# Patient Record
Sex: Female | Born: 1955
Health system: Southern US, Community
[De-identification: ages and names within clinical notes are randomized; demographics above are authoritative.]

## PROBLEM LIST (undated history)

## (undated) DIAGNOSIS — F900 Attention-deficit hyperactivity disorder, predominantly inattentive type: Secondary | ICD-10-CM

## (undated) DIAGNOSIS — F329 Major depressive disorder, single episode, unspecified: Secondary | ICD-10-CM

## (undated) DIAGNOSIS — F988 Other specified behavioral and emotional disorders with onset usually occurring in childhood and adolescence: Secondary | ICD-10-CM

## (undated) DIAGNOSIS — R413 Other amnesia: Principal | ICD-10-CM

## (undated) DIAGNOSIS — E213 Hyperparathyroidism, unspecified: Secondary | ICD-10-CM

## (undated) DIAGNOSIS — M1990 Unspecified osteoarthritis, unspecified site: Secondary | ICD-10-CM

## (undated) DIAGNOSIS — D649 Anemia, unspecified: Secondary | ICD-10-CM

## (undated) DIAGNOSIS — F419 Anxiety disorder, unspecified: Secondary | ICD-10-CM

## (undated) DIAGNOSIS — E559 Vitamin D deficiency, unspecified: Secondary | ICD-10-CM

## (undated) DIAGNOSIS — J45909 Unspecified asthma, uncomplicated: Secondary | ICD-10-CM

## (undated) DIAGNOSIS — M8580 Other specified disorders of bone density and structure, unspecified site: Secondary | ICD-10-CM

## (undated) DIAGNOSIS — J302 Other seasonal allergic rhinitis: Secondary | ICD-10-CM

## (undated) DIAGNOSIS — I10 Essential (primary) hypertension: Secondary | ICD-10-CM

## (undated) DIAGNOSIS — R0602 Shortness of breath: Secondary | ICD-10-CM

## (undated) DIAGNOSIS — I2510 Atherosclerotic heart disease of native coronary artery without angina pectoris: Secondary | ICD-10-CM

## (undated) DIAGNOSIS — J069 Acute upper respiratory infection, unspecified: Secondary | ICD-10-CM

## (undated) DIAGNOSIS — F411 Generalized anxiety disorder: Secondary | ICD-10-CM

## (undated) DIAGNOSIS — A6920 Lyme disease, unspecified: Secondary | ICD-10-CM

## (undated) HISTORY — PX: PARATHYROIDECTOMY: SHX19

## (undated) HISTORY — DX: Generalized anxiety disorder: F41.1

## (undated) HISTORY — PX: DIAGNOSTIC LAPAROSCOPY: SUR761

## (undated) HISTORY — DX: Hyperparathyroidism, unspecified: E21.3

## (undated) HISTORY — DX: Other amnesia: R41.3

## (undated) HISTORY — PX: OTHER SURGICAL HISTORY: SHX169

## (undated) HISTORY — PX: WISDOM TOOTH EXTRACTION: SHX21

## (undated) HISTORY — DX: Hypercalcemia: E83.52

## (undated) HISTORY — DX: Attention-deficit hyperactivity disorder, predominantly inattentive type: F90.0

## (undated) HISTORY — DX: Major depressive disorder, single episode, unspecified: F32.9

## (undated) HISTORY — DX: Other specified disorders of bone density and structure, unspecified site: M85.80

---

## 1998-12-01 ENCOUNTER — Other Ambulatory Visit: Admission: RE | Admit: 1998-12-01 | Discharge: 1998-12-01 | Payer: Self-pay | Admitting: Internal Medicine

## 2006-05-31 ENCOUNTER — Ambulatory Visit: Payer: Self-pay | Admitting: Internal Medicine

## 2006-07-15 ENCOUNTER — Ambulatory Visit: Payer: Self-pay | Admitting: Internal Medicine

## 2006-07-24 ENCOUNTER — Ambulatory Visit: Payer: Self-pay | Admitting: Internal Medicine

## 2006-07-24 LAB — CONVERTED CEMR LAB
Albumin: 3.5 g/dL (ref 3.5–5.2)
Alkaline Phosphatase: 74 units/L (ref 39–117)
Basophils Absolute: 0 10*3/uL (ref 0.0–0.1)
CO2: 29 meq/L (ref 19–32)
Creatinine, Ser: 0.9 mg/dL (ref 0.4–1.2)
Eosinophils Absolute: 0.3 10*3/uL (ref 0.0–0.6)
GFR calc non Af Amer: 70 mL/min
Glucose, Bld: 85 mg/dL (ref 70–99)
HDL: 46.6 mg/dL (ref >39.0)
Hemoglobin: 14.2 g/dL (ref 12.0–15.0)
MCHC: 36 g/dL (ref 30.0–36.0)
Monocytes Relative: 6.4 % (ref 3.0–11.0)
Neutro Abs: 5.8 10*3/uL (ref 1.4–7.7)
Neutrophils Relative %: 67.1 % (ref 43.0–77.0)
Platelets: 450 10*3/uL — ABNORMAL HIGH (ref 150–400)
Potassium: 4.3 meq/L (ref 3.5–5.1)
RBC: 4.43 M/uL (ref 3.87–5.11)
RDW: 12.6 % (ref 11.5–14.6)
Sodium: 140 meq/L (ref 135–145)
Total Bilirubin: 0.9 mg/dL (ref 0.3–1.2)
Total CHOL/HDL Ratio: 5.2
Total Protein: 6.8 g/dL (ref 6.0–8.3)
Triglycerides: 137 mg/dL (ref 0–149)
VLDL: 27 mg/dL (ref 0–40)

## 2007-03-07 DIAGNOSIS — F329 Major depressive disorder, single episode, unspecified: Secondary | ICD-10-CM

## 2007-03-20 ENCOUNTER — Encounter: Payer: Self-pay | Admitting: Internal Medicine

## 2007-07-03 HISTORY — PX: BACK SURGERY: SHX140

## 2008-01-08 ENCOUNTER — Observation Stay (HOSPITAL_COMMUNITY): Admission: RE | Admit: 2008-01-08 | Discharge: 2008-01-09 | Payer: Self-pay | Admitting: Orthopedic Surgery

## 2009-02-23 ENCOUNTER — Ambulatory Visit: Admission: RE | Admit: 2009-02-23 | Discharge: 2009-02-23 | Payer: Self-pay | Admitting: Gynecologic Oncology

## 2009-03-08 ENCOUNTER — Ambulatory Visit: Payer: Self-pay | Admitting: Genetic Counselor

## 2009-05-30 ENCOUNTER — Ambulatory Visit (HOSPITAL_COMMUNITY): Admission: RE | Admit: 2009-05-30 | Discharge: 2009-05-30 | Payer: Self-pay | Admitting: Obstetrics and Gynecology

## 2010-10-04 LAB — BASIC METABOLIC PANEL
BUN: 16 mg/dL (ref 6–23)
Chloride: 108 mEq/L (ref 96–112)
Creatinine, Ser: 0.68 mg/dL (ref 0.4–1.2)
GFR calc non Af Amer: >60 mL/min (ref >60)
Glucose, Bld: 95 mg/dL (ref 70–99)
Potassium: 4 mEq/L (ref 3.5–5.1)

## 2010-10-04 LAB — CBC
HCT: 38.8 % (ref 36.0–46.0)
MCV: 92.6 fL (ref 78.0–100.0)
Platelets: 434 10*3/uL — ABNORMAL HIGH (ref 150–400)
RDW: 13.4 % (ref 11.5–15.5)

## 2010-11-14 NOTE — Consult Note (Signed)
[name] [name], [name] [name]                 ACCOUNT NO.:  [id number]   MEDICAL RECORD NO.:  [id number]          PATIENT TYPE:  OUT   LOCATION:  GYN                          FACILITY:  [facility]   PHYSICIAN:  John T. Kyla Balzarine, M.D.    DATE OF BIRTH:  Sep 28, 1955   DATE OF CONSULTATION:  02/23/2009  DATE OF DISCHARGE:                                 CONSULTATION   CHIEF COMPLAINT:  This 55 year old nulligravida is seen at the request  of Dr. Vincente Poli with a family history and elevated CA-125 value in  conjunction with menorrhagia, endometrial polyp and secretory  endometrial biopsy.   HISTORY OF PRESENT ILLNESS:  This patient gives a longstanding history  of intermittent menorrhagia, most recently having 3 weeks of bleeding in  May.  She was investigated for iron deficiency anemia with hematocrit  32.4% and ferritin level of 4.  She had fatigue with her anemia and this  has improved since starting iron replacement.  The patient subsequently  saw Dr. Vincente Poli in early July, was treated with Prometrium and underwent  negative cervical cytology, endometrial biopsy with benign secretory  endometrium and on August 12 underwent pelvic ultrasound which showed  inhomogeneous myometrium with questionable adenomyosis, versus a 4.8-cm  fibroid, 7-mm endometrial thickness and a less than 2-cm questionable  hemorrhagic cyst of the left ovary.  No cysts involved the right ovary  and there was no free fluid.  Normal blood flow was identified within  the ovary.  Saline infusion was performed, revealing a 12-mm polypoid  mass within the endometrial cavity.  Prior to the this ultrasound, the  patient's sister had been diagnosed and debulked of ovarian cancer; CA-  125 value was obtained and was 42.7.  The patient, understandably, is  quite anxious regarding this result.  On close questioning, she denies  change in appetite, early satiety, bloating, increased GERD, obstructive  type symptoms or change in bowel function.   Last menstrual period onset  was August 24 and flow until now has been normal.   PAST MEDICAL HISTORY:  Significant for chronic acne, depression and  nullagravidity.  She has used oral contraceptives for more than 10 years  of her reproductive life, last in her 30s.  She has degenerative disk  disease and underwent a microlaminectomy.   CURRENT MEDICATIONS:  Include Pristiq, iron supplementation, Claritin,  Adderall, and Tazorac topical therapy for acne.   ALLERGIES:  SULFA DRUGS.   PERSONAL AND SOCIAL HISTORY:  The patient is married, admits occasional  tobacco use and less than 10 alcoholic beverages per week.   FAMILY HISTORY:  A maternal aunt had breast cancer in her 40s or 29s and  some unknown type of gynecologic malignancy.  The patient's sister was  diagnosed with ovarian cancer recently at age 27 and has not undergone  genetic testing, however, this is planned.  The patient's mother died  in her 8s of the eclampsia and her father had prostatic cancer in his  late 55s or 53s.  Maternal and paternal grandparents died in their 49s  and 84s also.  No other known  gynecologic, breast or colon malignancies.   REVIEW OF SYSTEMS:  Marked anxiety and fatigue related to anemia, as  above.  The patient is scheduled for colonoscopy in mid September but  this has not been performed yet.   EXAM:  VITAL SIGNS:  Weight 184 pounds and vital signs stable/ afebrile.  GENERAL:  The patient is markedly anxious, alert and oriented x3 in no  acute distress.  Pain score zero.  ENT:  Clear oropharynx, no scleral icterus and full extraocular  movements.  NECK:  Supple neck without goiter.  LYMPHATIC SURVEY:  Negative for pathologic lymphadenopathy.  LUNGS:  Clear.  HEART:  Regular rate and rhythm without JVD.  ABDOMEN:  Obese, soft and benign with no hernia, ascites, tenderness or  mass.  EXTREMITIES:  Full strength and range of motion without cords or  Homan's.  SKIN:  No suspicious  lesions.  NEUROLOGIC SCREEN:  Intact.  PELVIC:  External genitalia and BUS, bladder and urethra normal.  There  is menstrual blood in the vagina.  Cervix is without lesions and is  mobile without tenderness.  On bimanual and rectovaginal examinations,  uterus is upper limits size with no tenderness.  There is no adnexal  mass or cul-de-sac nodularity and no adnexal tenderness.   LABS:  As above.   ASSESSMENT:  1. Less than 2-cm left adnexal cyst, likely functional, associated      with borderline CA-125 value elevation in a menstruating woman with      secretory endometrium, implying ovulation.  2. Endometrial polyp.   I had a long discussion with the patient regarding differential  diagnosis.  Although her major concern revolves around ovarian cancer,  the likelihood of the cyst being malignant is extremely small.  I would  recommend repeating the CA-125 value, as it is frequently elevated in  premenopausal women.  This should be obtained in mid-September so that  there is at least 4-6 weeks between CA-125 values.  At some juncture,  the patient would need to undergo hysteroscopy/polypectomy with D and C.  This should be deferred until after colonoscopy and gynecologic  treatment planning.  The patient will consider options of total  laparoscopic hysterectomy with BSO, laparoscopic BSO combined with  hysteroscopic D and C or hysteroscopic D and C only with serial  observation of her CA-125 value has normalized.  We discussed the  theoretical benefits of prophylactic risk reduction BSO and believe  that this procedure could be performed by Dr. Vincente Poli.  We would be glad  to see her at any time on a p.r.n. basis if there is concern because of  a marked rise in her CA-125 value.      John T. Kyla Balzarine, M.D.  Electronically Signed     JTS/MEDQ  D:  02/23/2009  T:  02/23/2009  Job:  [id number]   cc:   Telford Nab, R.N.  [address]  [place], [place] [address]   Stann Mainland.  Vincente Poli, M.D.  Fax: [phone]

## 2010-11-14 NOTE — Op Note (Signed)
[name] [name], [name] [name]                 ACCOUNT NO.:  [id number]   MEDICAL RECORD NO.:  [id number]          PATIENT TYPE:  AMB   LOCATION:  DAY                          FACILITY:  [facility]   PHYSICIAN:  Ronald A. Gioffre, M.D.DATE OF BIRTH:  [birth year]   DATE OF PROCEDURE:  01/08/2008  DATE OF DISCHARGE:                               OPERATIVE REPORT   SURGEON:  Georges Lynch. Darrelyn Hillock, M.D.   ASSISTANT:  Marlowe Kays, M.D.   PREOPERATIVE DIAGNOSES:  1. Lateral recess stenosis at L5-S1 on the left.  2. Herniated lumbar disk L5-S1 on the left.  3. Degenerative disk disease at L5-S1 on the left.   POSTOPERATIVE DIAGNOSES:  1. Lateral recess stenosis at L5-S1 on the left.  2. Herniated lumbar disk L5-S1 on the left.  3. Degenerative disk disease at L5-S1 on the left.   OPERATION:  1. Decompression lateral recess for lateral recess stenosis at L5-S1      on the left.  2. Foraminotomy L5-S1 left.  3. Microdiskectomy L5-S1 left.   NOTE:  She had weakness of her plantar flexors preop.   ANESTHESIA:  General.   PROCEDURE IN DETAIL:  Under general anesthesia with the patient on  spinal frame, routine orthopedic prep and draping of the lower back was  carried out.  She had 2 grams of IV Ancef preop.  At this time after  sterile prepping and draping of the back was carried out two needles  were placed in the back for localization purposes.  X-ray was taken.  We  then made an incision over the L5-S1 interspace.  Bleeders identified  and cauterized.  I separated the muscle from both sides of the spinous  processes mainly for retraction purposes.  I then went down the left  side in the usual fashion and separated the muscle from the lamina and  the spinous process.  I identified the L5-S1 interspace and another x-  ray was taken to verify the position.  I then carried out a  hemilaminectomy in the usual fashion.  Note the disk space was collapsed  because of her degenerative arthritis so we had  to go far out wide to  decompress the lateral recesses well.  We went down and did a  foraminotomy and the microscope obviously was used on this part of the  procedure.  We gently removed the ligamentum flavum and protected the  dura at all times while we did this.  Note the lateral recess was quite  contracted.  She had a large mass of lateral recess veins that literally  was tethering the nerve root.  We gently lifted those veins up off the  root, held them with a nerve hook and cauterized those with the bipolar.  We finally separated the S1 root from the lateral recess.  We gently  retracted it over, identified the disk and made a cruciate incision in  the disk space and noted that there were some osteophytic changes there  and marked narrowing of the disk space.  We used the nerve hook and the  Epstein curettes to go  subligamentous and remove the disk material.  We  went caudal as well as proximal to complete the diskectomy.  We made  multiple passes to make sure the nerve root was free.  I also went in  the axilla of the root and made sure there were no fragments there.  There were none.  We did a nice foraminotomy for the root as well  distally.  When the procedure was completed we thoroughly irrigated the  area out.  We were now easily able to move the dura and the nerve root  over to the midline without any difficulty whatsoever.  Initially the  root was tethered down as I mentioned above.   I then loosely applied some thrombin-soaked Gelfoam, closed the wound in  layers in the usual fashion except the small deep distal part of the  wound was slightly left open for drainage purposes.  Subcu was closed  with Vicryl, skin with metal staples and a sterile Neosporin dressing  was applied.  The patient returned to the recovery room.           ______________________________  Georges Lynch Darrelyn Hillock, M.D.     RAG/MEDQ  D:  01/08/2008  T:  01/08/2008  Job:  [id number]

## 2010-11-17 NOTE — Assessment & Plan Note (Signed)
[facility]                            [facility] OFFICE NOTE   NAME:[name], [name] ALAMILLO                        MRN:          [id number]  DATE:05/31/2006                            DOB:          [birth year]    CHIEF COMPLAINT:  To establish, a couple of issues.   HISTORY OF PRESENT ILLNESS:  [name] [name] is a 55 year old, few cigarette  a day, married white female, carries the diagnosis of ADD with some  depression, under control, adult acne, who comes in today for a first-  time visit.  She previously had a primary care physician a long time  ago, Dr. Lenord Fellers, but has not had one most recently.  Her psychiatrist is  Dr. Evelene Croon, was previously Dr. Kathrynn Running, who moved out of the state.  She  uses the allergist across the street, I assume Dr. Blossom Hoops office,  and she sees Howard Pouch with Dr. Jorja Loa, a dermatologist.  She has  a remote history of seeing an orthopedist in the past, but nothing  recent, and she has also seen a neurologist about 8 years ago regarding  some neurologic symptoms that include an MRI of the brain and a spinal  tap, which was normal but some questioned abnormality on the brain, and  was told to recheck as needed.  Today her main concern is pain in her  right hip that is coming and going over the last year and a half.  At  some point it was so severe that it would wake her when sleeping, but is  usually associated with standing, exercise, and decreased with rest.  She describes it as to the right trochanteric area with occasionally  radiation to the groin, and one time was down to the knee, but not down  to her foot, without numbness and tingling.  She has no specific injury  in this area, has tried aspirin, Aleve and Advil, and the anti-  inflammatories do tend to help but got some stomach nausea at some point  and stopped, worried about the side effects.  Her other joints are  reasonably okay.  There was also a history of elevated  cholesterol  reading in a screening test a year and a half or two years ago, and has  not had blood work done at any time in the recent past.   PAST MEDICAL HISTORY:  See database.  SURGERIES:  None.  Last Pap June  2007 by GYN.  She is primiparous.  Last mammogram June 2007.  LMP June  28.  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   MEDICATIONS:  1. Prozac 40 mg a day.  2. Adderall 20 mg b.i.d.  3. Wellbutrin - unclear dosage.  4. Tazorac 0.5% for face.   DRUG ALLERGIES:  SULFA.   FAMILY HISTORY:  Father died at age 41 of leukemia and possibly side  effects of radiation treatment for the prostate cancer that he had,  mother died when she was age 53 of a cerebral hemorrhage.  A  maternal  grandmother had a stroke of some sort.  She had a first cousin with type  1 diabetes and uncles with type 2 diabetes.  No substance abuse problems  in the family to her knowledge or ADHD that she is aware of.  No family  history of arthritis that she is aware of.   SOCIAL HISTORY:  Household of 2.  Tetanus shot 7 years ago.  Has never  had a colonoscopy.  Works for a [employer], is nonprofit work.  Tobacco a couple of cigarettes here and there, smoked when she was in  teen or college but has stopped.  Alcohol max 5 a week usually.  Some  caffeine.  Exercise minimal because is limited by her right hip pain.  See database.   REVIEW OF SYSTEMS:  Negative for chest pain, shortness of breath, other  significant symptoms relating to HPI.   PHYSICAL EXAMINATION:  Height 5 feet 5-1/4inches, weight 185,  temperature 98.7.  Pulse 72 and regular, blood pressure 132/68,  respirations 20.  This is a well-developed, well-nourished, healthy-appearing, middle-aged  lady in no acute distress.  Her gait is not antalgic, and her balance  appears good.  NECK:  Supple without masses, thyromegaly or bruit.  CHEST:  CTA, BS   equal.  CARDIAC:  S1, S2, no gallops or murmurs.  Peripheral pulses present  without delay, no obvious edema.  ABDOMEN:  Soft without organomegaly, guarding or rebound.  Examination of her hip, she has good range of motion with some  discomfort on internal/external rotation, and no joint effusion is  noted.  NEUROLOGIC:  Grossly intact.  There is no obvious atrophy.   IMPRESSION:  1. Right hip pain, bursitis and/or arthritis.  I feel this needs      orthopedic evaluation and imaging of some sort.  History of      questioned side effect to the nonsteroidal anti-inflammatory      medications.  However, they were quite helpful at times.  She can      go ahead and take 2 Aleve twice daily as needed, but can use      Prilosec for gastrointestinal protection.  We discussed plans as      far as optimization of some type of exercise that will not      aggravate her hip pain, and get better delineation as to the cause      to prevent further problem.  2. Attention deficit disorder.  Followed by Dr. Evelene Croon.  3. History of hyperlipidemia or elevated cholesterol.  4. History of a neurologic workup 7 to 10 years ago.  We will get      records in this regard.  We will schedule for complete physical      examination labs and a 30-minute slot to go over these things, and      will follow up with other medical issues as appropriate.     Neta Mends. Panosh, MD  Electronically Signed    WKP/MedQ  DD: 05/31/2006  DT: 06/02/2006  Job #: [id number]

## 2011-03-29 LAB — URINALYSIS, ROUTINE W REFLEX MICROSCOPIC
Hgb urine dipstick: NEGATIVE
Ketones, ur: NEGATIVE
Nitrite: NEGATIVE

## 2011-03-29 LAB — CBC
Platelets: 482 — ABNORMAL HIGH
RDW: 13.4
WBC: 9.8

## 2011-03-29 LAB — URINE CULTURE
Colony Count: NO GROWTH
Culture: NO GROWTH

## 2011-03-29 LAB — COMPREHENSIVE METABOLIC PANEL
ALT: 31
AST: 30
Albumin: 3.8
Alkaline Phosphatase: 84
Potassium: 4.5
Sodium: 140
Total Protein: 7

## 2011-03-29 LAB — DIFFERENTIAL
Basophils Relative: 0
Eosinophils Absolute: 0.3
Eosinophils Relative: 3
Monocytes Absolute: 0.8
Monocytes Relative: 8

## 2011-03-29 LAB — APTT: aPTT: 34

## 2011-07-03 HISTORY — PX: DILATION AND CURETTAGE OF UTERUS: SHX78

## 2011-07-10 ENCOUNTER — Other Ambulatory Visit: Payer: Self-pay

## 2011-07-10 ENCOUNTER — Other Ambulatory Visit: Payer: Self-pay | Admitting: Family Medicine

## 2011-07-10 DIAGNOSIS — D259 Leiomyoma of uterus, unspecified: Secondary | ICD-10-CM

## 2011-07-13 ENCOUNTER — Ambulatory Visit: Payer: Self-pay

## 2011-07-13 ENCOUNTER — Ambulatory Visit
Admission: RE | Admit: 2011-07-13 | Discharge: 2011-07-13 | Disposition: A | Discharge status: Home / Self Care | Payer: BC Managed Care – PPO | Source: Ambulatory Visit | Attending: Family Medicine | Admitting: Family Medicine

## 2011-07-13 DIAGNOSIS — D259 Leiomyoma of uterus, unspecified: Secondary | ICD-10-CM

## 2011-08-14 ENCOUNTER — Other Ambulatory Visit: Payer: Self-pay | Admitting: Obstetrics and Gynecology

## 2011-08-21 ENCOUNTER — Other Ambulatory Visit: Payer: Self-pay | Admitting: Family Medicine

## 2011-08-21 DIAGNOSIS — M79606 Pain in leg, unspecified: Secondary | ICD-10-CM

## 2011-08-22 ENCOUNTER — Other Ambulatory Visit: Payer: Self-pay | Admitting: Family Medicine

## 2011-08-22 DIAGNOSIS — M79606 Pain in leg, unspecified: Secondary | ICD-10-CM

## 2011-08-23 ENCOUNTER — Ambulatory Visit
Admission: RE | Admit: 2011-08-23 | Discharge: 2011-08-23 | Disposition: A | Discharge status: Home / Self Care | Payer: BC Managed Care – PPO | Source: Ambulatory Visit | Attending: Family Medicine | Admitting: Family Medicine

## 2011-08-23 ENCOUNTER — Other Ambulatory Visit: Payer: BC Managed Care – PPO

## 2011-08-23 DIAGNOSIS — M79606 Pain in leg, unspecified: Secondary | ICD-10-CM

## 2011-10-29 ENCOUNTER — Other Ambulatory Visit: Payer: Self-pay | Admitting: Family Medicine

## 2011-10-29 DIAGNOSIS — Z1231 Encounter for screening mammogram for malignant neoplasm of breast: Secondary | ICD-10-CM

## 2011-10-29 DIAGNOSIS — Z780 Asymptomatic menopausal state: Secondary | ICD-10-CM

## 2011-11-06 ENCOUNTER — Other Ambulatory Visit: Payer: BC Managed Care – PPO

## 2011-11-06 ENCOUNTER — Ambulatory Visit: Payer: BC Managed Care – PPO

## 2011-11-27 ENCOUNTER — Ambulatory Visit: Payer: BC Managed Care – PPO

## 2011-11-27 ENCOUNTER — Other Ambulatory Visit: Payer: BC Managed Care – PPO

## 2012-02-27 ENCOUNTER — Encounter (HOSPITAL_COMMUNITY): Payer: Self-pay | Admitting: Pharmacist

## 2012-03-06 ENCOUNTER — Encounter (HOSPITAL_COMMUNITY): Payer: Self-pay | Admitting: *Deleted

## 2012-03-10 ENCOUNTER — Encounter (HOSPITAL_COMMUNITY): Payer: Self-pay

## 2012-03-10 ENCOUNTER — Encounter (HOSPITAL_COMMUNITY)
Admission: RE | Admit: 2012-03-10 | Discharge: 2012-03-10 | Disposition: A | Discharge status: Home / Self Care | Payer: BC Managed Care – PPO | Source: Ambulatory Visit | Attending: Obstetrics and Gynecology | Admitting: Obstetrics and Gynecology

## 2012-03-10 HISTORY — DX: Shortness of breath: R06.02

## 2012-03-10 HISTORY — DX: Acute upper respiratory infection, unspecified: J06.9

## 2012-03-10 LAB — CBC
HCT: 26.3 % — ABNORMAL LOW (ref 36.0–46.0)
MCHC: 30.8 g/dL (ref 30.0–36.0)
MCV: 92.6 fL (ref 78.0–100.0)
Platelets: 505 10*3/uL — ABNORMAL HIGH (ref 150–400)
RDW: 14.4 % (ref 11.5–15.5)

## 2012-03-10 NOTE — Pre-Procedure Instructions (Signed)
Pt c/o upper resp symptoms, which she thinks is allergy related. She has a productive cough and head congestion. Pt is afebrile. Dr. Arby Barrette made aware of pt's symptoms.

## 2012-03-10 NOTE — Patient Instructions (Signed)
Your procedure is scheduled on:03/12/12  Enter through the Main Entrance at :1130 am Pick up desk phone and dial [phone] and inform us of your arrival.  Please call [phone] if you have any problems the morning of surgery.  Remember: Do not eat after midnight:Tuesday Do not drink after:9am on Wed  Take these meds the morning of surgery with a sip of water:Lexapro, allergy med   DO NOT wear jewelry, eye make-up, lipstick,body lotion, or dark fingernail polish. Do not shave for 48 hours prior to surgery.  Patients discharged on the day of surgery will not be allowed to drive home.   Remember to use your Hibiclens as instructed.

## 2012-03-12 ENCOUNTER — Encounter (HOSPITAL_COMMUNITY): Payer: Self-pay | Admitting: Anesthesiology

## 2012-03-12 ENCOUNTER — Ambulatory Visit (HOSPITAL_COMMUNITY): Payer: BC Managed Care – PPO | Admitting: Anesthesiology

## 2012-03-12 ENCOUNTER — Encounter (HOSPITAL_COMMUNITY): Payer: Self-pay | Admitting: *Deleted

## 2012-03-12 ENCOUNTER — Encounter (HOSPITAL_COMMUNITY)
Admission: RE | Disposition: A | Discharge status: Home / Self Care | Payer: Self-pay | Source: Ambulatory Visit | Attending: Obstetrics and Gynecology

## 2012-03-12 ENCOUNTER — Ambulatory Visit (HOSPITAL_COMMUNITY)
Admission: RE | Admit: 2012-03-12 | Discharge: 2012-03-12 | Disposition: A | Discharge status: Home / Self Care | Payer: BC Managed Care – PPO | Source: Ambulatory Visit | Attending: Obstetrics and Gynecology | Admitting: Obstetrics and Gynecology

## 2012-03-12 DIAGNOSIS — N925 Other specified irregular menstruation: Secondary | ICD-10-CM | POA: Insufficient documentation

## 2012-03-12 DIAGNOSIS — Z01818 Encounter for other preprocedural examination: Secondary | ICD-10-CM | POA: Insufficient documentation

## 2012-03-12 DIAGNOSIS — N949 Unspecified condition associated with female genital organs and menstrual cycle: Secondary | ICD-10-CM | POA: Insufficient documentation

## 2012-03-12 DIAGNOSIS — F3289 Other specified depressive episodes: Secondary | ICD-10-CM

## 2012-03-12 DIAGNOSIS — F329 Major depressive disorder, single episode, unspecified: Secondary | ICD-10-CM

## 2012-03-12 DIAGNOSIS — D649 Anemia, unspecified: Secondary | ICD-10-CM | POA: Insufficient documentation

## 2012-03-12 DIAGNOSIS — N840 Polyp of corpus uteri: Secondary | ICD-10-CM

## 2012-03-12 DIAGNOSIS — N938 Other specified abnormal uterine and vaginal bleeding: Secondary | ICD-10-CM | POA: Insufficient documentation

## 2012-03-12 DIAGNOSIS — Z01812 Encounter for preprocedural laboratory examination: Secondary | ICD-10-CM | POA: Insufficient documentation

## 2012-03-12 HISTORY — DX: Major depressive disorder, single episode, unspecified: F32.9

## 2012-03-12 HISTORY — DX: Anxiety disorder, unspecified: F41.9

## 2012-03-12 HISTORY — DX: Anemia, unspecified: D64.9

## 2012-03-12 HISTORY — DX: Vitamin D deficiency, unspecified: E55.9

## 2012-03-12 HISTORY — DX: Other seasonal allergic rhinitis: J30.2

## 2012-03-12 HISTORY — DX: Other specified behavioral and emotional disorders with onset usually occurring in childhood and adolescence: F98.8

## 2012-03-12 SURGERY — DILATATION & CURETTAGE/HYSTEROSCOPY WITH THERMACHOICE ABLATION
Anesthesia: Choice | Site: Uterus | Wound class: Clean Contaminated

## 2012-03-12 MED ORDER — HYDROCODONE-ACETAMINOPHEN 5-325 MG PO TABS
ORAL_TABLET | ORAL | Status: AC
  Filled 2012-03-12: qty 1

## 2012-03-12 MED ORDER — BUPIVACAINE IN DEXTROSE 0.75-8.25 % IT SOLN
INTRATHECAL | Status: DC | PRN
  Administered 2012-03-12: 1.2 mL via INTRATHECAL

## 2012-03-12 MED ORDER — MEPERIDINE HCL 25 MG/ML IJ SOLN: 6.2500 mg | INTRAMUSCULAR | Status: DC | PRN

## 2012-03-12 MED ORDER — PROPOFOL 10 MG/ML IV EMUL
INTRAVENOUS | Status: DC | PRN
  Administered 2012-03-12: 20 mg via INTRAVENOUS
  Administered 2012-03-12 (×3): 10 mg via INTRAVENOUS

## 2012-03-12 MED ORDER — ONDANSETRON HCL 4 MG/2ML IJ SOLN
INTRAMUSCULAR | Status: AC
  Filled 2012-03-12: qty 2

## 2012-03-12 MED ORDER — KETOROLAC TROMETHAMINE 30 MG/ML IJ SOLN
INTRAMUSCULAR | Status: AC
  Filled 2012-03-12: qty 1

## 2012-03-12 MED ORDER — LACTATED RINGERS IV SOLN
INTRAVENOUS | Status: DC
  Administered 2012-03-12: 12:00:00 via INTRAVENOUS

## 2012-03-12 MED ORDER — FENTANYL CITRATE 0.05 MG/ML IJ SOLN
INTRAMUSCULAR | Status: AC
  Administered 2012-03-12: 50 ug via INTRAVENOUS
  Filled 2012-03-12: qty 2

## 2012-03-12 MED ORDER — IBUPROFEN 200 MG PO TABS: 600.0000 mg | ORAL_TABLET | Freq: Four times a day (QID) | ORAL | Status: AC | PRN

## 2012-03-12 MED ORDER — MIDAZOLAM HCL 2 MG/2ML IJ SOLN
INTRAMUSCULAR | Status: AC
  Filled 2012-03-12: qty 2

## 2012-03-12 MED ORDER — LACTATED RINGERS IV SOLN
INTRAVENOUS | Status: DC
  Administered 2012-03-12 (×2): via INTRAVENOUS

## 2012-03-12 MED ORDER — LIDOCAINE HCL 1 % IJ SOLN
INTRAMUSCULAR | Status: DC | PRN
  Administered 2012-03-12: 20 mL

## 2012-03-12 MED ORDER — OXYCODONE-ACETAMINOPHEN 5-325 MG PO TABS: 1.0000 | ORAL_TABLET | ORAL | Status: AC | PRN

## 2012-03-12 MED ORDER — KETOROLAC TROMETHAMINE 30 MG/ML IJ SOLN
INTRAMUSCULAR | Status: DC | PRN
  Administered 2012-03-12: 30 mg via INTRAVENOUS

## 2012-03-12 MED ORDER — PROPOFOL 10 MG/ML IV EMUL
INTRAVENOUS | Status: AC
  Filled 2012-03-12: qty 20

## 2012-03-12 MED ORDER — CEFAZOLIN SODIUM-DEXTROSE 2-3 GM-% IV SOLR
2.0000 g | INTRAVENOUS | Status: AC
  Administered 2012-03-12: 2 g via INTRAVENOUS

## 2012-03-12 MED ORDER — DEXTROSE 5 % IV SOLN
INTRAVENOUS | Status: DC | PRN
  Administered 2012-03-12: 1000 mL via INTRAVENOUS

## 2012-03-12 MED ORDER — FERUMOXYTOL INJECTION 510 MG/17 ML
510.0000 mg | Freq: Once | INTRAVENOUS | Status: AC
  Administered 2012-03-12: 510 mg via INTRAVENOUS
  Filled 2012-03-12: qty 17

## 2012-03-12 MED ORDER — ONDANSETRON HCL 4 MG/2ML IJ SOLN
INTRAMUSCULAR | Status: DC | PRN
  Administered 2012-03-12: 4 mg via INTRAVENOUS

## 2012-03-12 MED ORDER — FENTANYL CITRATE 0.05 MG/ML IJ SOLN
25.0000 ug | INTRAMUSCULAR | Status: DC | PRN
  Administered 2012-03-12: 50 ug via INTRAVENOUS

## 2012-03-12 MED ORDER — FENTANYL CITRATE 0.05 MG/ML IJ SOLN
INTRAMUSCULAR | Status: AC
  Filled 2012-03-12: qty 2

## 2012-03-12 MED ORDER — LIDOCAINE HCL (CARDIAC) 20 MG/ML IV SOLN
INTRAVENOUS | Status: DC | PRN
  Administered 2012-03-12 (×2): 20 mg via INTRAVENOUS

## 2012-03-12 MED ORDER — MIDAZOLAM HCL 5 MG/5ML IJ SOLN
INTRAMUSCULAR | Status: DC | PRN
  Administered 2012-03-12: 2 mg via INTRAVENOUS

## 2012-03-12 MED ORDER — HYDROCODONE-ACETAMINOPHEN 5-325 MG PO TABS
1.0000 | ORAL_TABLET | Freq: Once | ORAL | Status: AC
  Administered 2012-03-12: 1 via ORAL

## 2012-03-12 MED ORDER — LIDOCAINE HCL (CARDIAC) 20 MG/ML IV SOLN
INTRAVENOUS | Status: AC
  Filled 2012-03-12: qty 5

## 2012-03-12 MED ORDER — CEFAZOLIN SODIUM-DEXTROSE 2-3 GM-% IV SOLR
INTRAVENOUS | Status: AC
  Filled 2012-03-12: qty 50

## 2012-03-12 MED ORDER — METOCLOPRAMIDE HCL 5 MG/ML IJ SOLN: 10.0000 mg | Freq: Once | INTRAMUSCULAR | Status: DC | PRN

## 2012-03-12 SURGICAL SUPPLY — 13 items
CANISTER SUCTION 2500CC (MISCELLANEOUS) ×2 IMPLANT
CATH ROBINSON RED A/P 16FR (CATHETERS) ×2 IMPLANT
CATH THERMACHOICE III (CATHETERS) ×2 IMPLANT
CLOTH BEACON ORANGE TIMEOUT ST (SAFETY) ×2 IMPLANT
CONTAINER PREFILL 10% NBF 60ML (FORM) ×4 IMPLANT
DRESSING TELFA 8X3 (GAUZE/BANDAGES/DRESSINGS) ×2 IMPLANT
GLOVE BIO SURGEON STRL SZ 6.5 (GLOVE) ×4 IMPLANT
GOWN PREVENTION PLUS LG XLONG (DISPOSABLE) ×2 IMPLANT
GOWN STRL REIN XL XLG (GOWN DISPOSABLE) ×2 IMPLANT
PACK HYSTEROSCOPY LF (CUSTOM PROCEDURE TRAY) ×2 IMPLANT
PAD OB MATERNITY 4.3X12.25 (PERSONAL CARE ITEMS) ×2 IMPLANT
TOWEL OR 17X24 6PK STRL BLUE (TOWEL DISPOSABLE) ×4 IMPLANT
WATER STERILE IRR 1000ML POUR (IV SOLUTION) ×2 IMPLANT

## 2012-03-12 NOTE — Anesthesia Postprocedure Evaluation (Signed)
  Anesthesia Post-op Note  Patient: [name] [name]  Procedure(s) Performed: Procedure(s) (LRB) with comments: DILATATION & CURETTAGE/HYSTEROSCOPY WITH THERMACHOICE ABLATION (N/A)  Patient Location: PACU  Anesthesia Type: Spinal  Level of Consciousness: awake, alert  and oriented  Airway and Oxygen Therapy: Patient Spontanous Breathing  Post-op Pain: none  Post-op Assessment: Post-op Vital signs reviewed, Patient's Cardiovascular Status Stable, Respiratory Function Stable, Patent Airway, No signs of Nausea or vomiting, Pain level controlled, No headache and No backache  Post-op Vital Signs: Reviewed and stable  Complications: No apparent anesthesia complications

## 2012-03-12 NOTE — Anesthesia Preprocedure Evaluation (Signed)
Anesthesia Evaluation  Patient identified by MRN, date of birth, ID band Patient awake    Reviewed: Allergy & Precautions, H&P , NPO status , Patient's Chart, lab work & pertinent test results  Airway Mallampati: I TM Distance: >3 FB Neck ROM: Full    Dental No notable dental hx. (+) Teeth Intact   Pulmonary shortness of breath and with exertion,  Allergic Rhinitis breath sounds clear to auscultation  Pulmonary exam normal       Cardiovascular negative cardio ROS  Rhythm:Regular Rate:Normal     Neuro/Psych PSYCHIATRIC DISORDERS Anxiety Depression ADD on Adderallnegative neurological ROS     GI/Hepatic negative GI ROS, Neg liver ROS,   Endo/Other  negative endocrine ROS  Renal/GU negative Renal ROS  negative genitourinary   Musculoskeletal negative musculoskeletal ROS (+)   Abdominal (+) + obese,   Peds  Hematology Anemia   Anesthesia Other Findings   Reproductive/Obstetrics Endometrial polyp PMB                           Anesthesia Physical Anesthesia Plan  ASA: II  Anesthesia Plan: Spinal   Post-op Pain Management:    Induction:   Airway Management Planned: Natural Airway  Additional Equipment:   Intra-op Plan:   Post-operative Plan: Extubation in OR  Informed Consent: I have reviewed the patients History and Physical, chart, labs and discussed the procedure including the risks, benefits and alternatives for the proposed anesthesia with the patient or authorized representative who has indicated his/her understanding and acceptance.   Dental advisory given  Plan Discussed with: Anesthesiologist, CRNA and Surgeon  Anesthesia Plan Comments:         Anesthesia Quick Evaluation

## 2012-03-12 NOTE — H&P (Signed)
56 year old G 1 P0 presents complaining of heavy vaginal bleeding. She had been 6 months without any bleeding and presented to my office on August 28 with heavy vaginal bleeding and a hemoglobin of 8. Ultrasound showed an enlarged uterus with a polyp with a "feeder" vessel in the endometrium. She had a small simple cyst on the right ovary. No definite fibroid seen.  Medical history Anemia  Surgical history  Laminectomy 2009  Family history  Sister with ovarian cancer  Meds Adderall  Lexapro claritin  Allergic to sulfa  Social history unremarkable  No tobacco No alcohol  ROS positive for fatigue  BP 104/50 General alert and oriented Lung CTAB Car RRR Abdomen is soft and nontender Pelvic enlarged uterus Moderate amount of vaginal bleeding  IMPRESSION: Endometrial polyp Menorrhagia  PLAN: D and C Hysteroscopy Endometrial polyp resection Thermachoice Endometrial ablation Risks reviewed with patient Consent signed

## 2012-03-12 NOTE — Brief Op Note (Signed)
03/12/2012  1:07 PM  PATIENT:  [name] [name]  55 y.o. female  PRE-OPERATIVE DIAGNOSIS:  EM polyp  POST-OPERATIVE DIAGNOSIS:  EM polyp  PROCEDURE:  Procedure(s) (LRB) with comments: DILATATION & CURETTAGE/HYSTEROSCOPY WITH THERMACHOICE ABLATION (N/A) Removal of endometrial tissue  SURGEON:  Surgeon(s) and Role:    * Jeani Hawking, MD - Primary  PHYSICIAN ASSISTANT:   ASSISTANTS: none   ANESTHESIA:   spinal  EBL:  Total I/O In: 800 [I.V.:800] Out: 100 [Urine:100]  BLOOD ADMINISTERED:none  DRAINS: none   LOCAL MEDICATIONS USED:  XYLOCAINE   SPECIMEN:  Source of Specimen:  uterine curettings and endometrial polyp  DISPOSITION OF SPECIMEN:  PATHOLOGY  COUNTS:  YES  TOURNIQUET:  * No tourniquets in log *  DICTATION: .Other Dictation: Dictation Number [id number]  PLAN OF CARE: Discharge to home after PACU  PATIENT DISPOSITION:  PACU - hemodynamically stable.   Delay start of Pharmacological VTE agent (>24hrs) due to surgical blood loss or risk of bleeding: not applicable

## 2012-03-12 NOTE — Progress Notes (Signed)
History and physical on the chart Hemoglobin is 8.1 Patient is still experiencing vaginal bleeding Also currently being treated for URI Will proceed with spinal for anesthesia Proceed with D and c hysteroscopy polyp removal and thermachoice WIll give her [name] [name] in recovery room  Discussed with patient

## 2012-03-12 NOTE — Transfer of Care (Signed)
Immediate Anesthesia Transfer of Care Note  Patient: [name] [name]  Procedure(s) Performed: Procedure(s) (LRB) with comments: DILATATION & CURETTAGE/HYSTEROSCOPY WITH THERMACHOICE ABLATION (N/A)  Patient Location: PACU  Anesthesia Type: Spinal  Level of Consciousness: awake, oriented and patient cooperative  Airway & Oxygen Therapy: Patient Spontanous Breathing and Patient connected to nasal cannula oxygen  Post-op Assessment: Report given to PACU RN and Post -op Vital signs reviewed and stable  Post vital signs: Reviewed and stable  Complications: No apparent anesthesia complications

## 2012-03-12 NOTE — Anesthesia Procedure Notes (Signed)
Spinal  Patient location during procedure: OR Start time: 03/12/2012 12:31 PM Staffing Anesthesiologist: Sybil Shrader A. Performed by: anesthesiologist  Preanesthetic Checklist Completed: patient identified, site marked, surgical consent, pre-op evaluation, timeout performed, IV checked, risks and benefits discussed and monitors and equipment checked Spinal Block Patient position: sitting Prep: site prepped and draped and DuraPrep Patient monitoring: heart rate, cardiac monitor, continuous pulse ox and blood pressure Approach: midline Location: L3-4 Injection technique: single-shot Needle Needle type: Sprotte  Needle gauge: 24 G Needle length: 9 cm Needle insertion depth: 4 cm Assessment Sensory level: T6 Additional Notes Patient tolerated procedure well. Adequate sensory level.

## 2012-03-13 NOTE — Op Note (Signed)
[name] [name], LIPPMAN                 ACCOUNT NO.:  [id number]  MEDICAL RECORD NO.:  [id number]  LOCATION:  [facility]                          FACILITY:  [facility]  PHYSICIAN:  Avagrace Botelho L. Ann Bohne, M.D.DATE OF BIRTH:  07/05/1955  DATE OF PROCEDURE:  03/12/2012 DATE OF DISCHARGE:  03/12/2012                              OPERATIVE REPORT   PREOPERATIVE DIAGNOSES:  Abnormal uterine bleeding, severe anemia, and endometrial polyps.  POSTOPERATIVE DIAGNOSES:  Abnormal uterine bleeding, severe anemia, and endometrial polyps.  PROCEDURE:  D and C, hysteroscopy, removal of endometrial tissue and ThermaChoice endometrial ablation.  SURGEON:  Diani Jillson L. Vincente Poli, M.D.  ANESTHESIA:  Spinal.  EBL:  Less than 50 mL.  COMPLICATIONS:  None.  PATHOLOGY:  Uterine curettings with endometrial tissue sent to Pathology.  PROCEDURE:  The patient was taken to the operating room.  She was given her spinal by Dr. Malen Gauze.  She was then placed in the lithotomy position.  She was then prepped and draped.  An in and out catheter was used to empty the bladder.  The speculum was inserted into the vagina. There was noted to be large amount of clots in the vagina.  The cervix was grasped with a tenaculum and a paracervical block was performed with Xylocaine.  A total of 10 mL was used.  The cervical internal os was gently dilated using Pratt dilators and the diagnostic hysteroscope was inserted into the uterine cavity.  There was good visualization, however, there was an abundant amount of clot in the endometrial cavity despite irrigation.  We removed the hysteroscope and then I used a sharp curette and thoroughly curetted a lot of old clots and tissue.  Some of it appeared to be polypoid in nature out of the uterine cavity.  We inserted the hysteroscope back into the uterine cavity and there was noted still to be some clots in there, but I could see no definite polyps.  I then curetted the uterus one more time.  I then  inserted the ThermaChoice 3 balloon and performed ThermaChoice endometrial ablation for 8 minutes according to the manufacturer's specifications.  At the end of the procedure, the intact balloon was removed, a total of 15 mL of D5W had been used in the balloon.  At the end of the procedure, no vaginal bleeding was noted whatsoever.  All instruments were removed from the vagina.  The patient will be taken to the recovery room in stable condition.  Because of her anemia, I will give her an iron injection prior to discharge from PACU.  She will follow up with me in 1 week.     Okechukwu Regnier L. Vincente Poli, M.D.     Florestine Avers  D:  03/12/2012  T:  03/13/2012  Job:  [id number]

## 2012-09-30 ENCOUNTER — Ambulatory Visit (INDEPENDENT_AMBULATORY_CARE_PROVIDER_SITE_OTHER): Payer: BC Managed Care – PPO | Admitting: Emergency Medicine

## 2012-09-30 VITALS — BP 120/79 | HR 108 | Temp 100.0°F | Resp 17 | Ht 65.0 in | Wt 189.0 lb

## 2012-09-30 DIAGNOSIS — T2610XA Burn of cornea and conjunctival sac, unspecified eye, initial encounter: Secondary | ICD-10-CM

## 2012-09-30 DIAGNOSIS — T2611XA Burn of cornea and conjunctival sac, right eye, initial encounter: Secondary | ICD-10-CM

## 2012-09-30 MED ORDER — OFLOXACIN 0.3 % OP SOLN: 1.0000 [drp] | OPHTHALMIC | Status: DC

## 2012-09-30 MED ORDER — HYDROCODONE-ACETAMINOPHEN 5-325 MG PO TABS: 1.0000 | ORAL_TABLET | ORAL | Status: DC | PRN
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
## 2012-09-30 NOTE — Patient Instructions (Addendum)
Corneal Abrasion  The cornea is the clear covering at the front and center of the eye. When looking at the colored portion (iris) of the eye, you are looking through that person's cornea.   This very thin tissue is made up of many layers. The surface layer is a single layer of cells called the corneal epithelium. This is one of the most sensitive tissues in the body. If a scratch or injury causes the corneal epithelium to come off, it is called a corneal abrasion. If the injury extends to the tissues below the epithelium, the condition is called a corneal ulcer.   CAUSES    Scratches.   Trauma.   Foreign body in the eye.   Some people have recurrences of abrasions in the area of the original injury even after they heal. This is called recurrent erosion syndrome. Recurrent erosion syndromes generally improve and go away with time.  SYMPTOMS    Eye pain.   Difficulty or inability to keep the injured eye open.   The eye becomes very sensitive to light.   Recurrent erosions tend to happen suddenly, first thing in the morning  usually upon awakening and opening the eyes.  DIAGNOSIS   Your eye professional can diagnose a corneal abrasion during an eye exam. Dye is usually placed in the eye using a drop or a small paper strip moistened by the patient's tears. When the eye is examined with a special light, the abrasion shows up clearly because of the dye.  TREATMENT    Small abrasions may be treated with antibiotic drops or ointment alone.   Usually a pressure patch is specially applied. Pressure patches prevent the eye from blinking, allowing the corneal epithelium to heal. Because blinking is less, a pressure patch also reduces the amount of pain present in the eye during healing. Most corneal abrasions heal within 2-3 days with no effect on vision. WARNING: Do not drive or operate machinery while your eye is patched. Your ability to judge distances is impaired.   If abrasion becomes infected and spreads to the  deeper tissues of the cornea, a corneal ulcer can result. This is serious because it can cause corneal scarring. Corneal scars interfere with light passing through the cornea, and cause a loss of vision in the involved eye.   If your caregiver has given you a follow-up appointment, it is very important to keep that appointment. Not keeping the appointment could result in a severe eye infection or permanent loss of vision. If there is any problem keeping the appointment, you must call back to this facility for assistance.  SEEK MEDICAL CARE IF:    You have pain, light sensitivity and a scratchy feeling in one eye (or both).   Your pressure patch keeps loosening up and you can blink your eye under the patch after treatment.   Any kind of discharge develops from the involved eye after treatment or if the lids stick together in the morning.   You have the same symptoms in the morning as you did with the original abrasion days, weeks or months after the abrasion healed.  MAKE SURE YOU:    Understand these instructions.   Will watch your condition.   Will get help right away if you are not doing well or get worse.  Document Released: 06/15/2000 Document Revised: 09/10/2011 Document Reviewed: 01/22/2008  ExitCare Patient Information 2013 [employer].

## 2014-04-03 ENCOUNTER — Ambulatory Visit (INDEPENDENT_AMBULATORY_CARE_PROVIDER_SITE_OTHER): Payer: BC Managed Care – PPO | Admitting: Family Medicine

## 2014-04-03 VITALS — BP 140/90 | HR 80 | Temp 98.1°F | Resp 16 | Ht 65.5 in | Wt 194.0 lb

## 2014-04-03 DIAGNOSIS — K047 Periapical abscess without sinus: Secondary | ICD-10-CM

## 2014-04-03 DIAGNOSIS — L03011 Cellulitis of right finger: Secondary | ICD-10-CM

## 2014-04-03 MED ORDER — AMOXICILLIN-POT CLAVULANATE 875-125 MG PO TABS: 1.0000 | ORAL_TABLET | Freq: Two times a day (BID) | ORAL | Status: DC

## 2014-04-03 NOTE — Progress Notes (Signed)
Subjective:  This chart was scribed for [name] Haber, MD by [name] [name], Medical Scribe. This patient was seen in Room 10 and the patient's care was started at 10:36 AM.    Patient ID: [name] [name], female    DOB: 12-10-1955, 58 y.o.   MRN: [id number]  HPI HPI Comments: [name] [name] is a 58 y.o. female who presents to the [facility] complaining of intermittent right foot pain with associated swelling and erythema that started one month ago after her toe nail on her right greater toe fell off while doing chores at her sisters house. She has been taking clindamycin for her foot pain with no relief to her symptoms. She had a root canal that has persisted and the dentist want her to have it operated on 6 days from today. Sister recently passed away 03-17-14  due to ovarian cancer. BRCA pt was tested for it and results came back negative. 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  Patient Active Problem List   Diagnosis Date Noted  . DEPRESSION 03/07/2007   Past Medical History  Diagnosis Date  . Seasonal allergies   . Anxiety   . Depression   . ADD (attention deficit disorder)   . Anemia   . Vitamin D deficiency   . Shortness of breath     due to anemia  . URI (upper respiratory infection)     current cough and head congestion- no fever   Past Surgical History  Procedure Laterality Date  . Wisdom tooth extraction    . Back cysts removed      multiple  removed x 4  . Dilation and curettage of uterus      hysteroscopy resect polyp  . Diagnostic laparoscopy     Allergies  Allergen Reactions  . Sulfa Antibiotics Other (See Comments)    Flu-like symptoms   Prior to Admission medications   Medication Sig Start Date End Date Taking? Authorizing Provider  amphetamine-dextroamphetamine (ADDERALL) 20 MG tablet Take 20 mg by mouth 3 (three) times daily as needed. Pt takes up to three times a day for ADD   Yes Historical Provider, MD  escitalopram (LEXAPRO) 20 MG  tablet Take 20 mg by mouth daily.   Yes Historical Provider, MD  fluticasone (FLONASE) 50 MCG/ACT nasal spray Place 2 sprays into the nose daily.   Yes Historical Provider, MD  levothyroxine (SYNTHROID, LEVOTHROID) 100 MCG tablet Take 100 mcg by mouth daily before breakfast.   Yes Historical Provider, MD  loratadine (CLARITIN) 10 MG tablet Take 10 mg by mouth daily.   Yes Historical Provider, MD  Multiple Vitamin (MULTIVITAMIN WITH MINERALS) TABS Take 1 tablet by mouth daily.   Yes Historical Provider, MD  amoxicillin-clavulanate (AUGMENTIN) 875-125 MG per tablet Take 1 tablet by mouth 2 (two) times daily. 04/03/14   [name] Haber, MD  HYDROcodone-acetaminophen (NORCO) 5-325 MG per tablet Take 1 tablet by mouth every 4 (four) hours as needed. 09/30/12   Roselee Culver, MD  ofloxacin (OCUFLOX) 0.3 % ophthalmic solution Place 1 drop into the right eye every 4 (four) hours. 09/30/12   Roselee Culver, MD   History   Social History  . Marital Status: Married    Spouse Name: N/A    Number of Children: N/A  . Years of Education: N/A   Occupational History  . Not on file.   Social History Main Topics  . Smoking status: Current Some Day Smoker -- 0.50 packs/day for 15  years    Types: Cigarettes  . Smokeless tobacco: Not on file  . Alcohol Use: 1.2 oz/week    2 Cans of beer per week     Comment: daily  . Drug Use: No  . Sexual Activity: No   Other Topics Concern  . Not on file   Social History Narrative  . No narrative on file   Review of Systems  Musculoskeletal: Positive for arthralgias.  Skin: Positive for color change. Negative for wound.      Objective:   Physical Exam  Nursing note and vitals reviewed. Constitutional: She is oriented to person, place, and time. She appears well-developed and well-nourished.  HENT:  Head: Normocephalic and atraumatic.  Swollen gum at the base of the 31st molar.   Eyes: EOM are normal.  Neck: Normal range of motion.  Cardiovascular:  Normal rate.   Pulmonary/Chest: Effort normal.  Musculoskeletal: Normal range of motion.  Neurological: She is alert and oriented to person, place, and time.  Skin: Skin is warm and dry. There is erythema.  Lost her toe nail on her right big toe. Skin is scaling, reddened and swollen.  Psychiatric: She has a normal mood and affect. Her behavior is normal.   BP 140/90  Pulse 80  Temp(Src) 98.1 F (36.7 C) (Oral)  Resp 16  Ht 5' 5.5" (1.664 m)  Wt 194 lb (87.998 kg)  BMI 31.78 kg/m2  SpO2 97%     Assessment & Plan:  I personally performed the services described in this documentation, which was scribed in my presence. The recorded information has been reviewed and is accurate.  Paronychia, right - Plan: amoxicillin-clavulanate (AUGMENTIN) 875-125 MG per tablet  Apical abscess - Plan: amoxicillin-clavulanate (AUGMENTIN) 875-125 MG per tablet  Signed, [name] Haber, MD

## 2014-04-08 ENCOUNTER — Other Ambulatory Visit: Payer: Self-pay | Admitting: Endodontics

## 2014-08-04 ENCOUNTER — Encounter: Payer: Self-pay | Admitting: Podiatrist

## 2014-08-04 ENCOUNTER — Ambulatory Visit (INDEPENDENT_AMBULATORY_CARE_PROVIDER_SITE_OTHER): Payer: BLUE CROSS/BLUE SHIELD | Admitting: Podiatrist

## 2014-08-04 VITALS — BP 124/61 | HR 80 | Resp 16

## 2014-08-04 DIAGNOSIS — L03031 Cellulitis of right toe: Secondary | ICD-10-CM

## 2014-08-04 DIAGNOSIS — L03011 Cellulitis of right finger: Secondary | ICD-10-CM

## 2014-08-04 NOTE — Patient Instructions (Signed)
Raynaud's Syndrome Raynaud's Syndrome is a disorder of the blood vessels in your hands and feet. It occurs when small arteries of the arms/hands or legs/feet become sensitive to cold or emotional upset. This causes the arteries to constrict, or narrow, and reduces blood flow to the area. The color in the fingers or toes changes from white to bluish to red and this is not usually painful. There may be numbness and tingling. Sores on the skin (ulcers) can form. Symptoms are usually relieved by warming. HOME CARE INSTRUCTIONS   Avoid exposure to cold. Keep your whole body warm and dry. Dress in layers. Wear mittens or gloves when handling ice or frozen food and when outdoors. Use holders for glasses or cans containing cold drinks. If possible, stay indoors during cold weather.  Limit your use of caffeine. Switch to decaffeinated coffee, tea, and soda pop. Avoid chocolate.  Avoid smoking or being around cigarette smoke. Smoke will make symptoms worse.  Wear loose fitting socks and comfortable, roomy shoes.  Avoid vibrating tools and machinery.  If possible, avoid stressful and emotional situations. Exercise, meditation and yoga may help you cope with stress. Biofeedback may be useful.  Ask your caregiver about medicine (calcium channel blockers) that may control Raynaud's phenomena. SEEK MEDICAL CARE IF:   Your discomfort becomes worse, despite conservative treatment.  You develop sores on your fingers and toes that do not heal. Document Released: 06/15/2000 Document Revised: 09/10/2011 Document Reviewed: 06/22/2008 Exit[employer] Patient Information 2015 [employer]. This information is not intended to replace advice given to you by your health care provider. Make sure you discuss any questions you have with your health care provider.  

## 2014-08-04 NOTE — Progress Notes (Signed)
   Subjective:    Patient ID: [name] [name], female    DOB: 23-Jul-1955, 59 y.o.   MRN: [id number]  HPI Comments: "I have a toenail that won't grow"  Patient c/o tender 1st toe/nail right since Aug. 2014. She injured the nail and the nail fell off. The nail has not grown back and she is concerned.     Review of Systems  HENT: Positive for hearing loss.   Gastrointestinal: Positive for diarrhea and abdominal distention.  Musculoskeletal: Positive for arthralgias and gait problem.  Skin: Positive for color change and rash.  All other systems reviewed and are negative.      Objective:   Physical Exam Patient is awake, alert, and oriented x 3.  In no acute distress.  Vascular status is intact with palpable pedal pulses at 1/4 DP and PT bilateral and capillary refill time within normal limits. Slight redness and coolness to the tips of the toes. Subjective pain is reported on the right 2nd toe at times. Neurological sensation is also intact bilaterally via Semmes Weinstein monofilament at 5/5 sites. Light touch, vibratory sensation, Achilles tendon reflex is intact. Dermatological exam reveals loss of right hallux nail in the past and a normal regrowth of a new nail.  No discoloration, loosening, redness, swelling or sign of infection noted. otherwise skin color, turger and texture as normal. No open lesions present.  Musculature intact with dorsiflexion, plantarflexion, inversion, eversion.     Assessment & Plan:  Right hallux nail avulsion- possible raynauds phenomenon  Plan:  Discussed watching the nail as it grows back and that its appearance is normal at this stage in its regrowth.  Discussed the possibility of raynauds and gave information to her to read and research.  She will call if she notices any sign of infection on the great toe or if she would like to investigate further raynauds.

## 2014-12-13 ENCOUNTER — Encounter: Payer: Self-pay | Admitting: Podiatry

## 2014-12-13 ENCOUNTER — Ambulatory Visit (INDEPENDENT_AMBULATORY_CARE_PROVIDER_SITE_OTHER): Payer: BLUE CROSS/BLUE SHIELD

## 2014-12-13 ENCOUNTER — Ambulatory Visit (INDEPENDENT_AMBULATORY_CARE_PROVIDER_SITE_OTHER): Payer: BLUE CROSS/BLUE SHIELD | Admitting: Podiatry

## 2014-12-13 VITALS — BP 130/75 | HR 78 | Resp 12 | Ht 66.0 in | Wt 190.0 lb

## 2014-12-13 DIAGNOSIS — M79674 Pain in right toe(s): Secondary | ICD-10-CM

## 2014-12-13 DIAGNOSIS — L600 Ingrowing nail: Secondary | ICD-10-CM | POA: Diagnosis not present

## 2014-12-13 NOTE — Patient Instructions (Signed)

## 2014-12-13 NOTE — Progress Notes (Signed)
   Subjective:    Patient ID: [name] [name], female    DOB: 1956/01/01, 59 y.o.   MRN: [id number]  HPI    Review of Systems  HENT:       Currently under the Periodontist care.  Gastrointestinal:       Currently under Gastroenterologist care.  All other systems reviewed and are negative.      Objective:   Physical Exam        Assessment & Plan:

## 2014-12-14 NOTE — Progress Notes (Signed)
Subjective:     Patient ID: [name] [name], female   DOB: Jun 24, 1956, 59 y.o.   MRN: [id number]  HPI patient gives 10 month history of damaged right hallux nail stating that it's not been growing out right and it's becoming increasingly sensitive especially on the corner towards her second toe. States that she's tried to trim it and soak it and pad without relief   Review of Systems  All other systems reviewed and are negative.      Objective:   Physical Exam  Constitutional: She is oriented to person, place, and time.  Cardiovascular: Intact distal pulses.   Musculoskeletal: Normal range of motion.  Neurological: She is oriented to person, place, and time.  Skin: Skin is warm.  Nursing note and vitals reviewed.  neurovascular status intact with muscle strength adequate range of motion within normal limits. Patient's found to have a thickened damaged hallux nail right distal one third along with an incurvated corner on the lateral side that's more painful than the rest of the nail. Unfortunately this is been present for 10 months without adequate growth     Assessment:     Probable damage to the entire hallux nail along with ingrown component lateral border that may be the source of most of her pain    Plan:     H&P and x-ray reviewed with patient concerning trauma. At this point I recommended removing the distal one third of the nail and taking out the lateral corner and applied chemical to try to prevent regrowth. I explained is a very good chance the rest of the nail will not grow out normally and ultimately the entire nail may need to be removed permanently. Today I infiltrated the right hallux 60 mg Xylocaine Marcaine mixture remove the distal one third of the entire nail bed and plate and then on the lateral corner removed the corner completely exposed the matrix and applied phenol 3 applications 30 seconds followed by alcohol lavage and sterile dressing. Gave instructions on soaks  and reappoint

## 2014-12-16 ENCOUNTER — Other Ambulatory Visit: Payer: Self-pay | Admitting: Gastroenterology

## 2014-12-16 DIAGNOSIS — R7989 Other specified abnormal findings of blood chemistry: Secondary | ICD-10-CM

## 2014-12-16 DIAGNOSIS — R945 Abnormal results of liver function studies: Principal | ICD-10-CM

## 2014-12-20 ENCOUNTER — Ambulatory Visit (HOSPITAL_COMMUNITY)
Admission: RE | Admit: 2014-12-20 | Discharge: 2014-12-20 | Disposition: A | Discharge status: Home / Self Care | Payer: BLUE CROSS/BLUE SHIELD | Source: Ambulatory Visit | Attending: Gastroenterology | Admitting: Gastroenterology

## 2014-12-20 DIAGNOSIS — R7989 Other specified abnormal findings of blood chemistry: Secondary | ICD-10-CM

## 2014-12-20 DIAGNOSIS — K769 Liver disease, unspecified: Secondary | ICD-10-CM | POA: Insufficient documentation

## 2014-12-20 DIAGNOSIS — K760 Fatty (change of) liver, not elsewhere classified: Secondary | ICD-10-CM | POA: Diagnosis not present

## 2014-12-20 DIAGNOSIS — R945 Abnormal results of liver function studies: Secondary | ICD-10-CM

## 2015-01-11 ENCOUNTER — Emergency Department (HOSPITAL_COMMUNITY)
Admission: EM | Admit: 2015-01-11 | Discharge: 2015-01-11 | Disposition: A | Discharge status: Home / Self Care | Payer: BLUE CROSS/BLUE SHIELD | Attending: Emergency Medicine | Admitting: Emergency Medicine

## 2015-01-11 ENCOUNTER — Encounter (HOSPITAL_COMMUNITY): Payer: Self-pay | Admitting: Emergency Medicine

## 2015-01-11 ENCOUNTER — Emergency Department (HOSPITAL_COMMUNITY): Payer: BLUE CROSS/BLUE SHIELD

## 2015-01-11 DIAGNOSIS — K219 Gastro-esophageal reflux disease without esophagitis: Secondary | ICD-10-CM

## 2015-01-11 DIAGNOSIS — Z79899 Other long term (current) drug therapy: Secondary | ICD-10-CM | POA: Insufficient documentation

## 2015-01-11 DIAGNOSIS — Z8709 Personal history of other diseases of the respiratory system: Secondary | ICD-10-CM | POA: Insufficient documentation

## 2015-01-11 DIAGNOSIS — R7989 Other specified abnormal findings of blood chemistry: Secondary | ICD-10-CM | POA: Diagnosis not present

## 2015-01-11 DIAGNOSIS — F419 Anxiety disorder, unspecified: Secondary | ICD-10-CM | POA: Diagnosis not present

## 2015-01-11 DIAGNOSIS — F329 Major depressive disorder, single episode, unspecified: Secondary | ICD-10-CM | POA: Insufficient documentation

## 2015-01-11 DIAGNOSIS — F909 Attention-deficit hyperactivity disorder, unspecified type: Secondary | ICD-10-CM | POA: Diagnosis not present

## 2015-01-11 DIAGNOSIS — Z720 Tobacco use: Secondary | ICD-10-CM | POA: Insufficient documentation

## 2015-01-11 DIAGNOSIS — R079 Chest pain, unspecified: Secondary | ICD-10-CM | POA: Diagnosis present

## 2015-01-11 DIAGNOSIS — R945 Abnormal results of liver function studies: Secondary | ICD-10-CM

## 2015-01-11 DIAGNOSIS — Z862 Personal history of diseases of the blood and blood-forming organs and certain disorders involving the immune mechanism: Secondary | ICD-10-CM | POA: Diagnosis not present

## 2015-01-11 DIAGNOSIS — Z8739 Personal history of other diseases of the musculoskeletal system and connective tissue: Secondary | ICD-10-CM | POA: Diagnosis not present

## 2015-01-11 LAB — HEPATIC FUNCTION PANEL
ALT: 104 U/L — ABNORMAL HIGH (ref 14–54)
AST: 69 U/L — ABNORMAL HIGH (ref 15–41)
Albumin: 4.4 g/dL (ref 3.5–5.0)
Alkaline Phosphatase: 110 U/L (ref 38–126)
Bilirubin, Direct: <0.1 mg/dL — ABNORMAL LOW (ref 0.1–0.5)
TOTAL PROTEIN: 8.2 g/dL — AB (ref 6.5–8.1)
Total Bilirubin: 0.7 mg/dL (ref 0.3–1.2)

## 2015-01-11 LAB — BASIC METABOLIC PANEL
ANION GAP: 8 (ref 5–15)
BUN: 20 mg/dL (ref 6–20)
CALCIUM: 10 mg/dL (ref 8.9–10.3)
CO2: 26 mmol/L (ref 22–32)
CREATININE: 0.89 mg/dL (ref 0.44–1.00)
Chloride: 105 mmol/L (ref 101–111)
GFR calc Af Amer: >60 mL/min (ref >60)
GLUCOSE: 107 mg/dL — AB (ref 65–99)
Potassium: 4.1 mmol/L (ref 3.5–5.1)
Sodium: 139 mmol/L (ref 135–145)

## 2015-01-11 LAB — CBC
HCT: 46.8 % — ABNORMAL HIGH (ref 36.0–46.0)
HEMOGLOBIN: 16.1 g/dL — AB (ref 12.0–15.0)
MCH: 31.4 pg (ref 26.0–34.0)
MCHC: 34.4 g/dL (ref 30.0–36.0)
MCV: 91.4 fL (ref 78.0–100.0)
Platelets: 378 10*3/uL (ref 150–400)
RBC: 5.12 MIL/uL — ABNORMAL HIGH (ref 3.87–5.11)
RDW: 13.2 % (ref 11.5–15.5)
WBC: 8.6 10*3/uL (ref 4.0–10.5)

## 2015-01-11 LAB — I-STAT TROPONIN, ED: Troponin i, poc: 0 ng/mL (ref 0.00–0.08)

## 2015-01-11 MED ORDER — LANSOPRAZOLE 15 MG PO CPDR: 15.0000 mg | DELAYED_RELEASE_CAPSULE | Freq: Every day | ORAL | Status: DC

## 2015-01-11 MED ORDER — GI COCKTAIL ~~LOC~~
30.0000 mL | Freq: Once | ORAL | Status: AC
Start: 2015-01-11 — End: 2015-01-11
  Administered 2015-01-11: 30 mL via ORAL
  Filled 2015-01-11: qty 30

## 2015-01-11 NOTE — ED Provider Notes (Signed)
CSN: [id number]     Arrival date & time 01/11/15  1654 History   First MD Initiated Contact with Patient 01/11/15 1859     Chief Complaint  Patient presents with  . Chest Pain     (Consider location/radiation/quality/duration/timing/severity/associated sxs/prior Treatment) HPI Comments: She states that she has recently been being seen by gi for fatty liver and abdominal discomfort  Patient is a 59 y.o. female presenting with chest pain. The history is provided by the patient. No language interpreter was used.  Chest Pain Pain location:  Substernal area Pain quality: burning   Pain radiates to:  Does not radiate Pain radiates to the back: no   Pain severity:  Moderate Onset quality:  Gradual Duration:  3 days Timing:  Intermittent Progression since onset: becoming more constant. Relieved by:  Nothing Worsened by:  Nothing tried Ineffective treatments:  None tried Associated symptoms: no fatigue, no lower extremity edema, no nausea, no shortness of breath and no syncope     Past Medical History  Diagnosis Date  . Seasonal allergies   . Anxiety   . Depression   . ADD (attention deficit disorder)   . Anemia   . Vitamin D deficiency   . Shortness of breath     due to anemia  . URI (upper respiratory infection)     current cough and head congestion- no fever   Past Surgical History  Procedure Laterality Date  . Wisdom tooth extraction    . Back cysts removed      multiple  removed x 4  . Dilation and curettage of uterus      hysteroscopy resect polyp  . Diagnostic laparoscopy     Family History  Problem Relation Age of Onset  . Cancer Father   . Cancer Sister   . Cancer Brother    History  Substance Use Topics  . Smoking status: Current Some Day Smoker -- 0.50 packs/day for 15 years    Types: Cigarettes  . Smokeless tobacco: Not on file  . Alcohol Use: 1.2 oz/week    2 Cans of beer per week     Comment: daily   OB History    No data available     Review  of Systems  Constitutional: Negative for fatigue.  Respiratory: Negative for shortness of breath.   Cardiovascular: Positive for chest pain. Negative for syncope.  Gastrointestinal: Negative for nausea.  All other systems reviewed and are negative.     Allergies  Sulfa antibiotics  Home Medications   Prior to Admission medications   Medication Sig Start Date End Date Taking? Authorizing Provider  amphetamine-dextroamphetamine (ADDERALL) 20 MG tablet Take 20 mg by mouth 3 (three) times daily as needed. Pt takes up to three times a day for ADD    Historical Provider, MD  buPROPion (WELLBUTRIN SR) 150 MG 12 hr tablet Take 150 mg by mouth 2 (two) times daily.    Historical Provider, MD  escitalopram (LEXAPRO) 20 MG tablet Take 20 mg by mouth daily.    Historical Provider, MD  levothyroxine (SYNTHROID, LEVOTHROID) 100 MCG tablet Take 100 mcg by mouth daily before breakfast.    Historical Provider, MD  Multiple Vitamin (MULTIVITAMIN WITH MINERALS) TABS Take 1 tablet by mouth daily.    Historical Provider, MD   BP 143/83 mmHg  Pulse 99  Temp(Src) 98.2 F (36.8 C) (Oral)  SpO2 99% Physical Exam  Constitutional: She is oriented to person, place, and time. She appears well-developed and well-nourished.  Cardiovascular: Normal rate and regular rhythm.   Pulmonary/Chest: Effort normal and breath sounds normal.  Abdominal: Soft. Bowel sounds are normal. There is tenderness in the epigastric area.  Musculoskeletal: Normal range of motion.  Neurological: She is alert and oriented to person, place, and time.  Skin: Skin is warm and dry.  Psychiatric: She has a normal mood and affect.  Nursing note and vitals reviewed.   ED Course  Procedures (including critical care time) Labs Review Labs Reviewed  BASIC METABOLIC PANEL - Abnormal; Notable for the following:    Glucose, Bld 107 (*)    All other components within normal limits  CBC - Abnormal; Notable for the following:    RBC 5.12 (*)     Hemoglobin 16.1 (*)    HCT 46.8 (*)    All other components within normal limits  HEPATIC FUNCTION PANEL - Abnormal; Notable for the following:    Total Protein 8.2 (*)    AST 69 (*)    ALT 104 (*)    Bilirubin, Direct <0.1 (*)    All other components within normal limits  I-STAT TROPOININ, ED    Imaging Review Dg Chest 2 View  01/11/2015   CLINICAL DATA:  Intermittent chest pain, nonsmoker  EXAM: CHEST  2 VIEW  COMPARISON:  None.  FINDINGS: Cardiomediastinal silhouette is unremarkable. No acute infiltrate or pleural effusion. No pulmonary edema. Linear atelectasis or scarring noted in lingula. Bony thorax is unremarkable.  IMPRESSION: No active cardiopulmonary disease.   Electronically Signed   By: Lahoma Crocker M.D.   On: 01/11/2015 17:42     EKG Interpretation   Date/Time:  Tuesday January 11 2015 17:11:24 EDT Ventricular Rate:  97 PR Interval:  152 QRS Duration: 84 QT Interval:  346 QTC Calculation: 439 R Axis:   53 Text Interpretation:  Normal sinus rhythm Cannot rule out Anterior infarct  , age undetermined Abnormal ECG No significant change since last tracing  Confirmed by Corry Ihnen  MD, Ovid Curd [phone]) on 01/11/2015 8:49:42 PM      MDM   Final diagnoses:  Chest pain, unspecified chest pain type  Gastroesophageal reflux disease, esophagitis presence not specified  Elevated LFTs    Doubt acs think more likely gerd. Symptoms have been going on for a couple of days, think one troponin is fine. Pt given resource guide to help with finding a new pcp    Glendell Docker, NP 01/11/15 5009  Davonna Belling, MD 01/12/15 [phone]

## 2015-01-11 NOTE — Discharge Instructions (Signed)
Chest Pain (Nonspecific) °It is often hard to give a specific diagnosis for the cause of chest pain. There is always a chance that your pain could be related to something serious, such as a heart attack or a blood clot in the lungs. You need to follow up with your health care provider for further evaluation. °CAUSES  °· Heartburn. °· Pneumonia or bronchitis. °· Anxiety or stress. °· Inflammation around your heart (pericarditis) or lung (pleuritis or pleurisy). °· A blood clot in the lung. °· A collapsed lung (pneumothorax). It can develop suddenly on its own (spontaneous pneumothorax) or from trauma to the chest. °· Shingles infection (herpes zoster virus). °The chest wall is composed of bones, muscles, and cartilage. Any of these can be the source of the pain. °· The bones can be bruised by injury. °· The muscles or cartilage can be strained by coughing or overwork. °· The cartilage can be affected by inflammation and become sore (costochondritis). °DIAGNOSIS  °Lab tests or other studies may be needed to find the cause of your pain. Your health care provider may have you take a test called an ambulatory electrocardiogram (ECG). An ECG records your heartbeat patterns over a 24-hour period. You may also have other tests, such as: °· Transthoracic echocardiogram (TTE). During echocardiography, sound waves are used to evaluate how blood flows through your heart. °· Transesophageal echocardiogram (TEE). °· Cardiac monitoring. This allows your health care provider to monitor your heart rate and rhythm in real time. °· Holter monitor. This is a portable device that records your heartbeat and can help diagnose heart arrhythmias. It allows your health care provider to track your heart activity for several days, if needed. °· Stress tests by exercise or by giving medicine that makes the heart beat faster. °TREATMENT  °· Treatment depends on what may be causing your chest pain. Treatment may include: °¨ Acid blockers for  heartburn. °¨ Anti-inflammatory medicine. °¨ Pain medicine for inflammatory conditions. °¨ Antibiotics if an infection is present. °· You may be advised to change lifestyle habits. This includes stopping smoking and avoiding alcohol, caffeine, and chocolate. °· You may be advised to keep your head raised (elevated) when sleeping. This reduces the chance of acid going backward from your stomach into your esophagus. °Most of the time, nonspecific chest pain will improve within 2-3 days with rest and mild pain medicine.  °HOME CARE INSTRUCTIONS  °· If antibiotics were prescribed, take them as directed. Finish them even if you start to feel better. °· For the next few days, avoid physical activities that bring on chest pain. Continue physical activities as directed. °· Do not use any tobacco products, including cigarettes, chewing tobacco, or electronic cigarettes. °· Avoid drinking alcohol. °· Only take medicine as directed by your health care provider. °· Follow your health care provider's suggestions for further testing if your chest pain does not go away. °· Keep any follow-up appointments you made. If you do not go to an appointment, you could develop lasting (chronic) problems with pain. If there is any problem keeping an appointment, call to reschedule. °SEEK MEDICAL CARE IF:  °· Your chest pain does not go away, even after treatment. °· You have a rash with blisters on your chest. °· You have a fever. °SEEK IMMEDIATE MEDICAL CARE IF:  °· You have increased chest pain or pain that spreads to your arm, neck, jaw, back, or abdomen. °· You have shortness of breath. °· You have an increasing cough, or you cough   up blood. °· You have severe back or abdominal pain. °· You feel nauseous or vomit. °· You have severe weakness. °· You faint. °· You have chills. °This is an emergency. Do not wait to see if the pain will go away. Get medical help at once. Call your local emergency services (911 in [place] Do not drive  yourself to the hospital. °MAKE SURE YOU:  °· Understand these instructions. °· Will watch your condition. °· Will get help right away if you are not doing well or get worse. °Document Released: 03/28/2005 Document Revised: 06/23/2013 Document Reviewed: 01/22/2008 °ExitCare® Patient Information ©2015 [employer]. This information is not intended to replace advice given to you by your health care provider. Make sure you discuss any questions you have with your health care provider. ° ° °Emergency Department Resource Guide °1) Find a Doctor and Pay Out of Pocket °Although you won't have to find out who is covered by your insurance plan, it is a good idea to ask around and get recommendations. You will then need to call the office and see if the doctor you have chosen will accept you as a new patient and what types of options they offer for patients who are self-pay. Some doctors offer discounts or will set up payment plans for their patients who do not have insurance, but you will need to ask so you aren't surprised when you get to your appointment. ° °2) Contact Your Local Health Department °Not all health departments have doctors that can see patients for sick visits, but many do, so it is worth a call to see if yours does. If you don't know where your local health department is, you can check in your phone book. The CDC also has a tool to help you locate your state's health department, and many state websites also have listings of all of their local health departments. ° °3) Find a Walk-in Clinic °If your illness is not likely to be very severe or complicated, you may want to try a walk in clinic. These are popping up all over the country in pharmacies, drugstores, and shopping centers. They're usually staffed by nurse practitioners or physician assistants that have been trained to treat common illnesses and complaints. They're usually fairly quick and inexpensive. However, if you have serious medical issues or  chronic medical problems, these are probably not your best option. ° °No Primary Care Doctor: °- Call [facility] at  832-8000 - they can help you locate a primary care doctor that  accepts your insurance, provides certain services, etc. °- Physician Referral Service- 1-800-533-3463 ° °Chronic Pain Problems: °Organization         Address  Phone   Notes  °[facility]  (336) 297-2271 Patients need to be referred by their primary care doctor.  ° °Medication Assistance: °Organization         Address  Phone   Notes  °[place] [facility] [address] °[place], [place] [address] (336) 641-8030 --Must be a resident of [place] °-- Must have NO insurance coverage whatsoever (no Medicaid/ Medicare, etc.) °-- The pt. MUST have a primary care doctor that directs their care regularly and follows them in the community °  °[facility]  (866) 331-1348   °[employer]  (888) 892-1162   ° °Agencies that provide inexpensive medical care: °Organization         Address  Phone   Notes  ° Family Medicine  (  336) 832-8035   °[facility]    (336) 832-7272   °[facility] [address] °[place], [place] [address] (336) 832-4777   °[facility] [address], °[place] (336) 271-4999   °[facility]    (336) 373-0678   °[facility]    (336) 272-1050   °[facility] ° [address], [place] Phone:  (336) 832-4444, Fax:  (336) 832-4440 Hours of Operation:  9 am - 6 pm, M-F.  Also accepts Medicaid/Medicare and self-pay.  °[facility] ° [address], [place] Phone: (336) 832-3150, Fax: (336) 832-3151. Hours of Operation:  8:30 am - 5:30 pm, M-F.  Also accepts Medicaid and self-pay.  °[facility] [place] [address], [place] Phone: (336) 878-6027   °[facility] [address], [place], [place]  (336)723-1848, Ext. 123 Mondays & Thursdays: 7-9 AM.  First 15 patients are seen on a first come, first serve basis. °  ° °Medicaid-accepting [place] Providers: ° °Organization         Address  Phone   Notes  °[facility] [address], [place] (336) 641-2100 Also accepts self-pay patients.  °[facility] [address], [place] ° (336) 856-9996   °[facility] [address], [place] (336) 288-8857   °[facility] [address] [place] [address], [place] (336) 299-7000   °Veita Bland [address], [place]  ° (336) 373-1557 Only accepts [place] Access Medicaid patients after they have their name applied to their card.  ° °Self-Pay (no insurance) in [place]: ° °Organization         Address  Phone   Notes  °Sickle Cell Patients, [facility] [address], [place] (336) 832-1970   °[facility] [address], [place] (336) 832-4400   °[facility] ° [address],  (336) 992-4800   °[facility]/Dr. Osei-Bonsu ° [address] [place] [address], [place] or [address], [place] (336) [phone] Phone number for both [place] and [place] locations is the same.  °[facility] [address], [place] (336) 299-0000   °[facility] [address] [place] [address], [place] or [address] (336) 852-7530 °(336) 878-2260   °[facility] [address], [place] (336) 350-1642, phone; (336) 294-5005, fax Sees patients 1st and 3rd Saturday of every month.  Must not qualify for public or private insurance (i.e. Medicaid, Medicare, [place] Health Choice, Veterans' Benefits) • Household income should be no more than 200% of the poverty level •The clinic cannot treat you if you are pregnant or think you are pregnant • Sexually transmitted  diseases are not treated at the clinic.  ° ° °Dental Care: °Organization         Address  Phone  Notes  °[facility] [address], [place] (336) 641-6152 Accepts children up to age 21 who are enrolled in Medicaid or [place] Health Choice; pregnant women with a Medicaid card; and children who have applied for Medicaid or [place] Health Choice, but were declined, whose parents can pay a reduced fee at time of service.  °[facility] [place]    [address], [place] (336) 641-7733 Accepts children up to age 21 who are enrolled in Medicaid or [place] Health Choice; pregnant women with a Medicaid card; and children who have applied for Medicaid or [place] Health Choice, but were declined, whose parents can pay a reduced fee at time of service.  °[facility] ° [address], [place] (336) 641-4533 Patients are seen by appointment only. Walk-ins are not accepted. [facility] will see patients 18 years of age and older. °Monday - Tuesday (8am-5pm) °Most Wednesdays (8:30-5pm) °$30 per visit, cash only  °[facility] ° [address], [place] (336) 641-4533 Patients are seen by appointment only. Walk-ins are not accepted. [facility] will see patients 18 years of age and older. °One Wednesday Evening (Monthly: Volunteer Based).  $30 per visit, cash only  °[facility]  (919) 537-3737 for adults; Children under age 4, call Graduate Pediatric Dentistry at ([phone]) [phone]. Children aged 4-14, please call (919) [phone] to request a pediatric application. ° Dental services are provided in all areas of dental care including fillings, crowns and bridges, complete and partial dentures, implants, gum treatment, root canals, and extractions. Preventive care is also provided. Treatment is provided to both adults and children. °Patients are selected via a  lottery and there is often a waiting list. °  °[facility] [address], °[place] ° (336) 763-8833 [email or web address] °  °[facility] [address], [place], [place] (336)723-1848, Ext. 123 Second and Fourth Thursday of each month, opens at 6:30 AM; Clinic ends at 9 AM.  Patients are seen on a first-come first-served basis, and a limited number are seen during each clinic.  ° °[facility] ° [address], [place], [place] (336) 723-7904   Eligibility Requirements °You must have lived in [place], [place], or [place] counties for at least the last three months. °  You cannot be eligible for state or federal sponsored healthcare insurance, including Veterans Administration, Medicaid, or Medicare. °  You generally cannot be eligible for healthcare insurance through your employer.  °  How to apply: °Eligibility screenings are held every Tuesday and Wednesday afternoon from 1:00 pm until 4:00 pm. You do not need an appointment for the interview!  °[facility] [address], [place], [place] 336-631-2330   °[facility]  336-342-8273   °[facility]  336-703-3100   °[facility]  336-570-6415   ° °Behavioral Health Resources in the Community: °Intensive Outpatient Programs °Organization         Address  Phone  Notes  °[facility] [address], [place], [place] 336-878-6098   °[facility] [address], [place], [place] 336-832-9800   °[facility] [address], [place], [place] ° 336-882-2125   °[facility] [address],  °[place], [place] 1-800-853-5163 or 336-641-4981   °Substance Abuse Resources °Organization         Address  Phone  Notes  °[facility]  336-882-2125   °[facility]  336-784-9470   °[facility]  336-285-9073   °[facility]  336-845-3988   °Residential &  Outpatient Substance Abuse Program  1-800-659-3381   °Psychological Services °Organization         Address  Phone  Notes  °[facility]  336- 832-9600   °  [facility]  336- 378-7881   °[facility] [address], [place] 1-800-853-5163 or 336-641-4981   ° °Mobile Crisis Teams °Organization         Address  Phone  Notes  °[facility], Mobile Crisis Care Unit  1-877-626-1772   °Assertive °[facility] ° [address] [place], [place] 336-834-9664   °Sharon DeEsch [address] °[place] 336-554-5454   ° °Self-Help/Support Groups °Organization         Address  Phone             Notes  °[facility] - variety of support groups  336- 373-1402 Call for more information  °Narcotics Anonymous (NA), [facility] [address], °[place]  2 meetings at this location  ° °Residential Treatment Programs °Organization         Address  Phone  Notes  °[facility] [address],    °[place]  1-866-801-8205   °[facility] ° [address], [place], [place] 704-293-8524   °[facility] [address], [place] 336-845-3988 Admissions: 8am-3pm M-F  °[facility] [address]    °[place], [place] 336-841-1104   °[facility] [address], [place], [place] 336-379-7146   °[facility] [address]  °[place], [place] 336-285-9073   °[facility] - Intensive Outpatient [address], , [place] 336-852-3033   °[facility] [address]  °[place], [place] 1-877-615-2722 or 336-784-9470   °[facility] [address] [place], [place] 336-227-7417 Accepts Medicaid  °[facility] [address]  ° [place] 1-800-659-3381 Substance Abuse/Addiction Treatment  ° °[place] Behavioral Health Resources °Organization          Address  Phone  Notes  °[facility]  (888) 581-9988   °Julie Brannon, PhD [address] [place], [place]   (336) 349-5553 or (336) 951-0000   °[facility]   [address] °[place], [place] (336) 349-4454   °[facility] [address], [place], [place] (336) 342-8316 Insurance/Medicaid/sponsorship through [facility]  °[facility] [address]                                    [place], [place] (336) 342-8316 Therapy/tele-psych/case  °[facility] [address]  ° [place], [place] (336) 349-2233    °Dr. Arfeen  (336) 349-4544   °[facility] [place]  [employer] [place] [facility] 1) [address], [place] °2) [address], [place] °3)  [address], [place] (336) 349-3220 °(336) 342-7768 ° °(336) 342-8140   °[place] [facility] (336) 342-1394 or (336) 342-3537 (After Hours)    ° ° ° °

## 2015-01-11 NOTE — ED Notes (Signed)
Pt has been having intermittent chest pain over the weekend that she describes as burning and squeezing. Alert and oriented.

## 2015-01-27 ENCOUNTER — Ambulatory Visit (INDEPENDENT_AMBULATORY_CARE_PROVIDER_SITE_OTHER): Payer: BLUE CROSS/BLUE SHIELD | Admitting: Podiatry

## 2015-01-27 ENCOUNTER — Encounter: Payer: Self-pay | Admitting: Podiatry

## 2015-01-27 VITALS — BP 134/81 | HR 95 | Resp 16

## 2015-01-27 DIAGNOSIS — L03031 Cellulitis of right toe: Secondary | ICD-10-CM | POA: Diagnosis not present

## 2015-01-27 DIAGNOSIS — L03011 Cellulitis of right finger: Secondary | ICD-10-CM

## 2015-01-27 MED ORDER — AMOXICILLIN-POT CLAVULANATE 875-125 MG PO TABS: 1.0000 | ORAL_TABLET | Freq: Two times a day (BID) | ORAL | Status: DC

## 2015-01-29 NOTE — Progress Notes (Signed)
He presents today with a chief complaint or concern of a mild area of erythema to the lateral border of the hallux right. He states has been there for a while since the matrixectomy was performed.  Objective: Vital signs are stable he is alert and oriented 3 pulses are strongly palpable right. Erythema and edema with pain on palpation fibular border hallux nail right status post matrixectomy.  Assessment: Paronychia hallux right.  Plan: Started him soaking in Epsom salts and warm water. Started him on Augmentin 875 one by mouth twice a day. Follow up with him in 1 month.

## 2015-01-31 ENCOUNTER — Telehealth: Payer: Self-pay | Admitting: *Deleted

## 2015-01-31 MED ORDER — CEPHALEXIN 500 MG PO CAPS: 500.0000 mg | ORAL_CAPSULE | Freq: Three times a day (TID) | ORAL | Status: DC

## 2015-01-31 NOTE — Telephone Encounter (Signed)
Change to keflex Ill send it.

## 2015-01-31 NOTE — Telephone Encounter (Addendum)
Pt states she is unable to take the Augmentin prescribed 01/27/2015, due to severe stomach distress, even eating with the food.  She states she has stopped the medication, but request further instruction, toe is getting better.  Left message informing pt of Dr. Stephenie Acres medication change, and to call with concerns.

## 2015-01-31 NOTE — Telephone Encounter (Signed)
Please advise 

## 2015-02-01 NOTE — Telephone Encounter (Signed)
Called pt yesterday at the end of the day and let her know the new antibiotic had been called into her pharmacy.

## 2015-02-24 ENCOUNTER — Ambulatory Visit: Payer: BLUE CROSS/BLUE SHIELD | Admitting: Podiatry

## 2015-03-10 ENCOUNTER — Ambulatory Visit (INDEPENDENT_AMBULATORY_CARE_PROVIDER_SITE_OTHER): Payer: BLUE CROSS/BLUE SHIELD | Admitting: Podiatry

## 2015-03-10 ENCOUNTER — Encounter: Payer: Self-pay | Admitting: Podiatry

## 2015-03-10 VITALS — BP 101/70 | HR 72 | Resp 12

## 2015-03-10 DIAGNOSIS — M2570 Osteophyte, unspecified joint: Secondary | ICD-10-CM

## 2015-03-10 DIAGNOSIS — L03031 Cellulitis of right toe: Secondary | ICD-10-CM | POA: Diagnosis not present

## 2015-03-10 DIAGNOSIS — M898X7 Other specified disorders of bone, ankle and foot: Secondary | ICD-10-CM

## 2015-03-10 DIAGNOSIS — L03011 Cellulitis of right finger: Secondary | ICD-10-CM

## 2015-03-10 NOTE — Progress Notes (Signed)
She presents today for follow-up of her IND hallux right. She states that the toenail is still sensitive to the touch. She states that she is not soaking it any longer. But the sheath bother her when she goes to bed.  Objective: Pulses are palpable right foot. No erythema edema saline as drainage or odor to the right hallux. She has some tenderness on palpation of distal most aspect of the hallux. Radiographs reviewed does demonstrate a subungual exostosis. She also demonstrates nail dystrophy hallux nail plate right.  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  Plan: Discussed etiology pathology conservative versus surgical therapies. At this point she wants to try trimming it herself and will follow up with [place] as needed for surgical intervention.

## 2015-03-31 ENCOUNTER — Telehealth: Payer: Self-pay | Admitting: *Deleted

## 2015-03-31 NOTE — Telephone Encounter (Signed)
Pt states she has questions about a bone spur to be removed from toe tip.  Pt states she has too much going on now but will make an appt to discuss with Dr. Milinda Pointer the toe surgery.

## 2015-10-03 DIAGNOSIS — J301 Allergic rhinitis due to pollen: Secondary | ICD-10-CM | POA: Diagnosis not present

## 2015-10-03 DIAGNOSIS — F320 Major depressive disorder, single episode, mild: Secondary | ICD-10-CM | POA: Diagnosis not present

## 2015-10-03 DIAGNOSIS — J3089 Other allergic rhinitis: Secondary | ICD-10-CM | POA: Diagnosis not present

## 2015-10-03 DIAGNOSIS — J3081 Allergic rhinitis due to animal (cat) (dog) hair and dander: Secondary | ICD-10-CM | POA: Diagnosis not present

## 2015-10-10 DIAGNOSIS — F320 Major depressive disorder, single episode, mild: Secondary | ICD-10-CM | POA: Diagnosis not present

## 2015-10-10 DIAGNOSIS — J3081 Allergic rhinitis due to animal (cat) (dog) hair and dander: Secondary | ICD-10-CM | POA: Diagnosis not present

## 2015-10-10 DIAGNOSIS — J3089 Other allergic rhinitis: Secondary | ICD-10-CM | POA: Diagnosis not present

## 2015-10-10 DIAGNOSIS — J301 Allergic rhinitis due to pollen: Secondary | ICD-10-CM | POA: Diagnosis not present

## 2015-10-13 DIAGNOSIS — J3089 Other allergic rhinitis: Secondary | ICD-10-CM | POA: Diagnosis not present

## 2015-10-13 DIAGNOSIS — J301 Allergic rhinitis due to pollen: Secondary | ICD-10-CM | POA: Diagnosis not present

## 2015-10-13 DIAGNOSIS — J3081 Allergic rhinitis due to animal (cat) (dog) hair and dander: Secondary | ICD-10-CM | POA: Diagnosis not present

## 2015-10-17 DIAGNOSIS — F320 Major depressive disorder, single episode, mild: Secondary | ICD-10-CM | POA: Diagnosis not present

## 2015-10-18 DIAGNOSIS — J301 Allergic rhinitis due to pollen: Secondary | ICD-10-CM | POA: Diagnosis not present

## 2015-10-18 DIAGNOSIS — J3081 Allergic rhinitis due to animal (cat) (dog) hair and dander: Secondary | ICD-10-CM | POA: Diagnosis not present

## 2015-10-18 DIAGNOSIS — J3089 Other allergic rhinitis: Secondary | ICD-10-CM | POA: Diagnosis not present

## 2015-10-25 DIAGNOSIS — J3081 Allergic rhinitis due to animal (cat) (dog) hair and dander: Secondary | ICD-10-CM | POA: Diagnosis not present

## 2015-10-25 DIAGNOSIS — J301 Allergic rhinitis due to pollen: Secondary | ICD-10-CM | POA: Diagnosis not present

## 2015-10-25 DIAGNOSIS — J3089 Other allergic rhinitis: Secondary | ICD-10-CM | POA: Diagnosis not present

## 2015-10-31 DIAGNOSIS — J3081 Allergic rhinitis due to animal (cat) (dog) hair and dander: Secondary | ICD-10-CM | POA: Diagnosis not present

## 2015-10-31 DIAGNOSIS — J301 Allergic rhinitis due to pollen: Secondary | ICD-10-CM | POA: Diagnosis not present

## 2015-10-31 DIAGNOSIS — J3089 Other allergic rhinitis: Secondary | ICD-10-CM | POA: Diagnosis not present

## 2015-10-31 DIAGNOSIS — F320 Major depressive disorder, single episode, mild: Secondary | ICD-10-CM | POA: Diagnosis not present

## 2015-11-03 DIAGNOSIS — Z86010 Personal history of colonic polyps: Secondary | ICD-10-CM | POA: Diagnosis not present

## 2015-11-03 DIAGNOSIS — K5730 Diverticulosis of large intestine without perforation or abscess without bleeding: Secondary | ICD-10-CM | POA: Diagnosis not present

## 2015-11-03 DIAGNOSIS — R748 Abnormal levels of other serum enzymes: Secondary | ICD-10-CM | POA: Diagnosis not present

## 2015-11-03 DIAGNOSIS — K7581 Nonalcoholic steatohepatitis (NASH): Secondary | ICD-10-CM | POA: Diagnosis not present

## 2015-11-04 DIAGNOSIS — J3081 Allergic rhinitis due to animal (cat) (dog) hair and dander: Secondary | ICD-10-CM | POA: Diagnosis not present

## 2015-11-04 DIAGNOSIS — J301 Allergic rhinitis due to pollen: Secondary | ICD-10-CM | POA: Diagnosis not present

## 2015-11-04 DIAGNOSIS — J3089 Other allergic rhinitis: Secondary | ICD-10-CM | POA: Diagnosis not present

## 2015-11-07 DIAGNOSIS — J3089 Other allergic rhinitis: Secondary | ICD-10-CM | POA: Diagnosis not present

## 2015-11-07 DIAGNOSIS — J3081 Allergic rhinitis due to animal (cat) (dog) hair and dander: Secondary | ICD-10-CM | POA: Diagnosis not present

## 2015-11-07 DIAGNOSIS — J301 Allergic rhinitis due to pollen: Secondary | ICD-10-CM | POA: Diagnosis not present

## 2015-11-11 DIAGNOSIS — J301 Allergic rhinitis due to pollen: Secondary | ICD-10-CM | POA: Diagnosis not present

## 2015-11-11 DIAGNOSIS — J3081 Allergic rhinitis due to animal (cat) (dog) hair and dander: Secondary | ICD-10-CM | POA: Diagnosis not present

## 2015-11-11 DIAGNOSIS — J3089 Other allergic rhinitis: Secondary | ICD-10-CM | POA: Diagnosis not present

## 2015-11-15 DIAGNOSIS — J3081 Allergic rhinitis due to animal (cat) (dog) hair and dander: Secondary | ICD-10-CM | POA: Diagnosis not present

## 2015-11-15 DIAGNOSIS — J301 Allergic rhinitis due to pollen: Secondary | ICD-10-CM | POA: Diagnosis not present

## 2015-11-15 DIAGNOSIS — J3089 Other allergic rhinitis: Secondary | ICD-10-CM | POA: Diagnosis not present

## 2015-11-21 DIAGNOSIS — F320 Major depressive disorder, single episode, mild: Secondary | ICD-10-CM | POA: Diagnosis not present

## 2015-12-15 DIAGNOSIS — J301 Allergic rhinitis due to pollen: Secondary | ICD-10-CM | POA: Diagnosis not present

## 2015-12-15 DIAGNOSIS — J3081 Allergic rhinitis due to animal (cat) (dog) hair and dander: Secondary | ICD-10-CM | POA: Diagnosis not present

## 2015-12-15 DIAGNOSIS — J3089 Other allergic rhinitis: Secondary | ICD-10-CM | POA: Diagnosis not present

## 2015-12-21 DIAGNOSIS — J3081 Allergic rhinitis due to animal (cat) (dog) hair and dander: Secondary | ICD-10-CM | POA: Diagnosis not present

## 2015-12-21 DIAGNOSIS — J3089 Other allergic rhinitis: Secondary | ICD-10-CM | POA: Diagnosis not present

## 2015-12-21 DIAGNOSIS — J301 Allergic rhinitis due to pollen: Secondary | ICD-10-CM | POA: Diagnosis not present

## 2015-12-23 DIAGNOSIS — J3089 Other allergic rhinitis: Secondary | ICD-10-CM | POA: Diagnosis not present

## 2015-12-23 DIAGNOSIS — J3081 Allergic rhinitis due to animal (cat) (dog) hair and dander: Secondary | ICD-10-CM | POA: Diagnosis not present

## 2015-12-23 DIAGNOSIS — J301 Allergic rhinitis due to pollen: Secondary | ICD-10-CM | POA: Diagnosis not present

## 2015-12-29 DIAGNOSIS — J301 Allergic rhinitis due to pollen: Secondary | ICD-10-CM | POA: Diagnosis not present

## 2015-12-29 DIAGNOSIS — J3089 Other allergic rhinitis: Secondary | ICD-10-CM | POA: Diagnosis not present

## 2015-12-29 DIAGNOSIS — J3081 Allergic rhinitis due to animal (cat) (dog) hair and dander: Secondary | ICD-10-CM | POA: Diagnosis not present

## 2016-01-04 DIAGNOSIS — J3089 Other allergic rhinitis: Secondary | ICD-10-CM | POA: Diagnosis not present

## 2016-01-04 DIAGNOSIS — J301 Allergic rhinitis due to pollen: Secondary | ICD-10-CM | POA: Diagnosis not present

## 2016-01-04 DIAGNOSIS — J3081 Allergic rhinitis due to animal (cat) (dog) hair and dander: Secondary | ICD-10-CM | POA: Diagnosis not present

## 2016-01-06 DIAGNOSIS — J3081 Allergic rhinitis due to animal (cat) (dog) hair and dander: Secondary | ICD-10-CM | POA: Diagnosis not present

## 2016-01-06 DIAGNOSIS — J3089 Other allergic rhinitis: Secondary | ICD-10-CM | POA: Diagnosis not present

## 2016-01-06 DIAGNOSIS — J301 Allergic rhinitis due to pollen: Secondary | ICD-10-CM | POA: Diagnosis not present

## 2016-01-11 DIAGNOSIS — J3081 Allergic rhinitis due to animal (cat) (dog) hair and dander: Secondary | ICD-10-CM | POA: Diagnosis not present

## 2016-01-11 DIAGNOSIS — R05 Cough: Secondary | ICD-10-CM | POA: Diagnosis not present

## 2016-01-11 DIAGNOSIS — J3089 Other allergic rhinitis: Secondary | ICD-10-CM | POA: Diagnosis not present

## 2016-01-11 DIAGNOSIS — J301 Allergic rhinitis due to pollen: Secondary | ICD-10-CM | POA: Diagnosis not present

## 2016-01-13 DIAGNOSIS — J301 Allergic rhinitis due to pollen: Secondary | ICD-10-CM | POA: Diagnosis not present

## 2016-01-13 DIAGNOSIS — J3089 Other allergic rhinitis: Secondary | ICD-10-CM | POA: Diagnosis not present

## 2016-01-13 DIAGNOSIS — J3081 Allergic rhinitis due to animal (cat) (dog) hair and dander: Secondary | ICD-10-CM | POA: Diagnosis not present

## 2016-01-17 DIAGNOSIS — H02831 Dermatochalasis of right upper eyelid: Secondary | ICD-10-CM | POA: Diagnosis not present

## 2016-01-17 DIAGNOSIS — H04123 Dry eye syndrome of bilateral lacrimal glands: Secondary | ICD-10-CM | POA: Diagnosis not present

## 2016-01-17 DIAGNOSIS — H02834 Dermatochalasis of left upper eyelid: Secondary | ICD-10-CM | POA: Diagnosis not present

## 2016-01-17 DIAGNOSIS — J301 Allergic rhinitis due to pollen: Secondary | ICD-10-CM | POA: Diagnosis not present

## 2016-01-17 DIAGNOSIS — J3081 Allergic rhinitis due to animal (cat) (dog) hair and dander: Secondary | ICD-10-CM | POA: Diagnosis not present

## 2016-01-17 DIAGNOSIS — J3089 Other allergic rhinitis: Secondary | ICD-10-CM | POA: Diagnosis not present

## 2016-01-17 DIAGNOSIS — H10413 Chronic giant papillary conjunctivitis, bilateral: Secondary | ICD-10-CM | POA: Diagnosis not present

## 2016-01-19 DIAGNOSIS — J3081 Allergic rhinitis due to animal (cat) (dog) hair and dander: Secondary | ICD-10-CM | POA: Diagnosis not present

## 2016-01-19 DIAGNOSIS — J3089 Other allergic rhinitis: Secondary | ICD-10-CM | POA: Diagnosis not present

## 2016-01-19 DIAGNOSIS — J301 Allergic rhinitis due to pollen: Secondary | ICD-10-CM | POA: Diagnosis not present

## 2016-01-24 DIAGNOSIS — J3081 Allergic rhinitis due to animal (cat) (dog) hair and dander: Secondary | ICD-10-CM | POA: Diagnosis not present

## 2016-01-24 DIAGNOSIS — J3089 Other allergic rhinitis: Secondary | ICD-10-CM | POA: Diagnosis not present

## 2016-01-24 DIAGNOSIS — J301 Allergic rhinitis due to pollen: Secondary | ICD-10-CM | POA: Diagnosis not present

## 2016-01-27 DIAGNOSIS — J301 Allergic rhinitis due to pollen: Secondary | ICD-10-CM | POA: Diagnosis not present

## 2016-01-27 DIAGNOSIS — J3081 Allergic rhinitis due to animal (cat) (dog) hair and dander: Secondary | ICD-10-CM | POA: Diagnosis not present

## 2016-01-27 DIAGNOSIS — J3089 Other allergic rhinitis: Secondary | ICD-10-CM | POA: Diagnosis not present

## 2016-01-30 DIAGNOSIS — M19041 Primary osteoarthritis, right hand: Secondary | ICD-10-CM | POA: Diagnosis not present

## 2016-01-30 DIAGNOSIS — R760 Raised antibody titer: Secondary | ICD-10-CM | POA: Diagnosis not present

## 2016-01-30 DIAGNOSIS — M19072 Primary osteoarthritis, left ankle and foot: Secondary | ICD-10-CM | POA: Diagnosis not present

## 2016-01-30 DIAGNOSIS — M19042 Primary osteoarthritis, left hand: Secondary | ICD-10-CM | POA: Diagnosis not present

## 2016-01-30 DIAGNOSIS — J301 Allergic rhinitis due to pollen: Secondary | ICD-10-CM | POA: Diagnosis not present

## 2016-01-30 DIAGNOSIS — J3089 Other allergic rhinitis: Secondary | ICD-10-CM | POA: Diagnosis not present

## 2016-01-30 DIAGNOSIS — M2550 Pain in unspecified joint: Secondary | ICD-10-CM | POA: Diagnosis not present

## 2016-01-30 DIAGNOSIS — M19071 Primary osteoarthritis, right ankle and foot: Secondary | ICD-10-CM | POA: Diagnosis not present

## 2016-01-30 DIAGNOSIS — J3081 Allergic rhinitis due to animal (cat) (dog) hair and dander: Secondary | ICD-10-CM | POA: Diagnosis not present

## 2016-02-03 DIAGNOSIS — J3081 Allergic rhinitis due to animal (cat) (dog) hair and dander: Secondary | ICD-10-CM | POA: Diagnosis not present

## 2016-02-03 DIAGNOSIS — J3089 Other allergic rhinitis: Secondary | ICD-10-CM | POA: Diagnosis not present

## 2016-02-03 DIAGNOSIS — J301 Allergic rhinitis due to pollen: Secondary | ICD-10-CM | POA: Diagnosis not present

## 2016-02-07 DIAGNOSIS — J301 Allergic rhinitis due to pollen: Secondary | ICD-10-CM | POA: Diagnosis not present

## 2016-02-07 DIAGNOSIS — J3089 Other allergic rhinitis: Secondary | ICD-10-CM | POA: Diagnosis not present

## 2016-02-07 DIAGNOSIS — J3081 Allergic rhinitis due to animal (cat) (dog) hair and dander: Secondary | ICD-10-CM | POA: Diagnosis not present

## 2016-02-10 DIAGNOSIS — J301 Allergic rhinitis due to pollen: Secondary | ICD-10-CM | POA: Diagnosis not present

## 2016-02-10 DIAGNOSIS — J3081 Allergic rhinitis due to animal (cat) (dog) hair and dander: Secondary | ICD-10-CM | POA: Diagnosis not present

## 2016-02-10 DIAGNOSIS — J3089 Other allergic rhinitis: Secondary | ICD-10-CM | POA: Diagnosis not present

## 2016-02-20 DIAGNOSIS — J301 Allergic rhinitis due to pollen: Secondary | ICD-10-CM | POA: Diagnosis not present

## 2016-02-20 DIAGNOSIS — J3081 Allergic rhinitis due to animal (cat) (dog) hair and dander: Secondary | ICD-10-CM | POA: Diagnosis not present

## 2016-02-20 DIAGNOSIS — J3089 Other allergic rhinitis: Secondary | ICD-10-CM | POA: Diagnosis not present

## 2016-02-21 DIAGNOSIS — J301 Allergic rhinitis due to pollen: Secondary | ICD-10-CM | POA: Diagnosis not present

## 2016-02-22 DIAGNOSIS — J3081 Allergic rhinitis due to animal (cat) (dog) hair and dander: Secondary | ICD-10-CM | POA: Diagnosis not present

## 2016-02-22 DIAGNOSIS — R5383 Other fatigue: Secondary | ICD-10-CM | POA: Diagnosis not present

## 2016-02-22 DIAGNOSIS — M359 Systemic involvement of connective tissue, unspecified: Secondary | ICD-10-CM | POA: Diagnosis not present

## 2016-02-22 DIAGNOSIS — J3089 Other allergic rhinitis: Secondary | ICD-10-CM | POA: Diagnosis not present

## 2016-02-22 DIAGNOSIS — F339 Major depressive disorder, recurrent, unspecified: Secondary | ICD-10-CM | POA: Diagnosis not present

## 2016-02-24 DIAGNOSIS — J301 Allergic rhinitis due to pollen: Secondary | ICD-10-CM | POA: Diagnosis not present

## 2016-02-24 DIAGNOSIS — J3081 Allergic rhinitis due to animal (cat) (dog) hair and dander: Secondary | ICD-10-CM | POA: Diagnosis not present

## 2016-02-24 DIAGNOSIS — J3089 Other allergic rhinitis: Secondary | ICD-10-CM | POA: Diagnosis not present

## 2016-02-29 DIAGNOSIS — F4322 Adjustment disorder with anxiety: Secondary | ICD-10-CM | POA: Diagnosis not present

## 2016-02-29 DIAGNOSIS — F3342 Major depressive disorder, recurrent, in full remission: Secondary | ICD-10-CM | POA: Diagnosis not present

## 2016-03-14 DIAGNOSIS — J3081 Allergic rhinitis due to animal (cat) (dog) hair and dander: Secondary | ICD-10-CM | POA: Diagnosis not present

## 2016-03-14 DIAGNOSIS — J301 Allergic rhinitis due to pollen: Secondary | ICD-10-CM | POA: Diagnosis not present

## 2016-03-14 DIAGNOSIS — J3089 Other allergic rhinitis: Secondary | ICD-10-CM | POA: Diagnosis not present

## 2016-03-16 DIAGNOSIS — J3081 Allergic rhinitis due to animal (cat) (dog) hair and dander: Secondary | ICD-10-CM | POA: Diagnosis not present

## 2016-03-16 DIAGNOSIS — J3089 Other allergic rhinitis: Secondary | ICD-10-CM | POA: Diagnosis not present

## 2016-03-16 DIAGNOSIS — J301 Allergic rhinitis due to pollen: Secondary | ICD-10-CM | POA: Diagnosis not present

## 2016-03-20 DIAGNOSIS — D239 Other benign neoplasm of skin, unspecified: Secondary | ICD-10-CM | POA: Diagnosis not present

## 2016-03-20 DIAGNOSIS — L720 Epidermal cyst: Secondary | ICD-10-CM | POA: Diagnosis not present

## 2016-03-20 DIAGNOSIS — L821 Other seborrheic keratosis: Secondary | ICD-10-CM | POA: Diagnosis not present

## 2016-03-22 DIAGNOSIS — J3081 Allergic rhinitis due to animal (cat) (dog) hair and dander: Secondary | ICD-10-CM | POA: Diagnosis not present

## 2016-03-22 DIAGNOSIS — J301 Allergic rhinitis due to pollen: Secondary | ICD-10-CM | POA: Diagnosis not present

## 2016-03-22 DIAGNOSIS — J3089 Other allergic rhinitis: Secondary | ICD-10-CM | POA: Diagnosis not present

## 2016-03-26 DIAGNOSIS — M359 Systemic involvement of connective tissue, unspecified: Secondary | ICD-10-CM | POA: Diagnosis not present

## 2016-03-26 DIAGNOSIS — J3089 Other allergic rhinitis: Secondary | ICD-10-CM | POA: Diagnosis not present

## 2016-03-26 DIAGNOSIS — M8588 Other specified disorders of bone density and structure, other site: Secondary | ICD-10-CM | POA: Diagnosis not present

## 2016-03-26 DIAGNOSIS — J3081 Allergic rhinitis due to animal (cat) (dog) hair and dander: Secondary | ICD-10-CM | POA: Diagnosis not present

## 2016-03-26 DIAGNOSIS — J301 Allergic rhinitis due to pollen: Secondary | ICD-10-CM | POA: Diagnosis not present

## 2016-03-27 ENCOUNTER — Other Ambulatory Visit: Payer: Self-pay | Admitting: Family Medicine

## 2016-03-27 DIAGNOSIS — E039 Hypothyroidism, unspecified: Secondary | ICD-10-CM

## 2016-03-27 DIAGNOSIS — Z1231 Encounter for screening mammogram for malignant neoplasm of breast: Secondary | ICD-10-CM

## 2016-03-27 DIAGNOSIS — N951 Menopausal and female climacteric states: Secondary | ICD-10-CM

## 2016-03-29 DIAGNOSIS — J3081 Allergic rhinitis due to animal (cat) (dog) hair and dander: Secondary | ICD-10-CM | POA: Diagnosis not present

## 2016-03-29 DIAGNOSIS — J301 Allergic rhinitis due to pollen: Secondary | ICD-10-CM | POA: Diagnosis not present

## 2016-03-29 DIAGNOSIS — J3089 Other allergic rhinitis: Secondary | ICD-10-CM | POA: Diagnosis not present

## 2016-04-03 DIAGNOSIS — J301 Allergic rhinitis due to pollen: Secondary | ICD-10-CM | POA: Diagnosis not present

## 2016-04-03 DIAGNOSIS — J3089 Other allergic rhinitis: Secondary | ICD-10-CM | POA: Diagnosis not present

## 2016-04-03 DIAGNOSIS — J3081 Allergic rhinitis due to animal (cat) (dog) hair and dander: Secondary | ICD-10-CM | POA: Diagnosis not present

## 2016-04-06 DIAGNOSIS — J301 Allergic rhinitis due to pollen: Secondary | ICD-10-CM | POA: Diagnosis not present

## 2016-04-06 DIAGNOSIS — J3089 Other allergic rhinitis: Secondary | ICD-10-CM | POA: Diagnosis not present

## 2016-04-06 DIAGNOSIS — J3081 Allergic rhinitis due to animal (cat) (dog) hair and dander: Secondary | ICD-10-CM | POA: Diagnosis not present

## 2016-04-10 DIAGNOSIS — J3089 Other allergic rhinitis: Secondary | ICD-10-CM | POA: Diagnosis not present

## 2016-04-10 DIAGNOSIS — J3081 Allergic rhinitis due to animal (cat) (dog) hair and dander: Secondary | ICD-10-CM | POA: Diagnosis not present

## 2016-04-10 DIAGNOSIS — J301 Allergic rhinitis due to pollen: Secondary | ICD-10-CM | POA: Diagnosis not present

## 2016-04-12 DIAGNOSIS — J3089 Other allergic rhinitis: Secondary | ICD-10-CM | POA: Diagnosis not present

## 2016-04-12 DIAGNOSIS — J3081 Allergic rhinitis due to animal (cat) (dog) hair and dander: Secondary | ICD-10-CM | POA: Diagnosis not present

## 2016-04-16 DIAGNOSIS — J3081 Allergic rhinitis due to animal (cat) (dog) hair and dander: Secondary | ICD-10-CM | POA: Diagnosis not present

## 2016-04-16 DIAGNOSIS — J301 Allergic rhinitis due to pollen: Secondary | ICD-10-CM | POA: Diagnosis not present

## 2016-04-16 DIAGNOSIS — J3089 Other allergic rhinitis: Secondary | ICD-10-CM | POA: Diagnosis not present

## 2016-04-19 DIAGNOSIS — J3089 Other allergic rhinitis: Secondary | ICD-10-CM | POA: Diagnosis not present

## 2016-04-19 DIAGNOSIS — J301 Allergic rhinitis due to pollen: Secondary | ICD-10-CM | POA: Diagnosis not present

## 2016-04-19 DIAGNOSIS — J3081 Allergic rhinitis due to animal (cat) (dog) hair and dander: Secondary | ICD-10-CM | POA: Diagnosis not present

## 2016-04-23 DIAGNOSIS — J3081 Allergic rhinitis due to animal (cat) (dog) hair and dander: Secondary | ICD-10-CM | POA: Diagnosis not present

## 2016-04-23 DIAGNOSIS — J301 Allergic rhinitis due to pollen: Secondary | ICD-10-CM | POA: Diagnosis not present

## 2016-04-23 DIAGNOSIS — J3089 Other allergic rhinitis: Secondary | ICD-10-CM | POA: Diagnosis not present

## 2016-04-24 DIAGNOSIS — M3500 Sicca syndrome, unspecified: Secondary | ICD-10-CM | POA: Diagnosis not present

## 2016-04-24 DIAGNOSIS — R768 Other specified abnormal immunological findings in serum: Secondary | ICD-10-CM | POA: Diagnosis not present

## 2016-04-24 DIAGNOSIS — D473 Essential (hemorrhagic) thrombocythemia: Secondary | ICD-10-CM | POA: Diagnosis not present

## 2016-05-02 DIAGNOSIS — J3081 Allergic rhinitis due to animal (cat) (dog) hair and dander: Secondary | ICD-10-CM | POA: Diagnosis not present

## 2016-05-02 DIAGNOSIS — J301 Allergic rhinitis due to pollen: Secondary | ICD-10-CM | POA: Diagnosis not present

## 2016-05-02 DIAGNOSIS — J3089 Other allergic rhinitis: Secondary | ICD-10-CM | POA: Diagnosis not present

## 2016-05-03 ENCOUNTER — Other Ambulatory Visit: Payer: Self-pay | Admitting: Family Medicine

## 2016-05-03 ENCOUNTER — Other Ambulatory Visit: Payer: Self-pay | Admitting: Physician Assistant

## 2016-05-03 DIAGNOSIS — N951 Menopausal and female climacteric states: Secondary | ICD-10-CM

## 2016-05-03 DIAGNOSIS — L723 Sebaceous cyst: Secondary | ICD-10-CM | POA: Diagnosis not present

## 2016-05-11 DIAGNOSIS — J301 Allergic rhinitis due to pollen: Secondary | ICD-10-CM | POA: Diagnosis not present

## 2016-05-11 DIAGNOSIS — J3089 Other allergic rhinitis: Secondary | ICD-10-CM | POA: Diagnosis not present

## 2016-05-11 DIAGNOSIS — J3081 Allergic rhinitis due to animal (cat) (dog) hair and dander: Secondary | ICD-10-CM | POA: Diagnosis not present

## 2016-05-17 ENCOUNTER — Ambulatory Visit
Admission: RE | Admit: 2016-05-17 | Discharge: 2016-05-17 | Disposition: A | Discharge status: Home / Self Care | Payer: BLUE CROSS/BLUE SHIELD | Source: Ambulatory Visit | Attending: Family Medicine | Admitting: Family Medicine

## 2016-05-17 DIAGNOSIS — Z1231 Encounter for screening mammogram for malignant neoplasm of breast: Secondary | ICD-10-CM | POA: Diagnosis not present

## 2016-05-17 DIAGNOSIS — M85851 Other specified disorders of bone density and structure, right thigh: Secondary | ICD-10-CM | POA: Diagnosis not present

## 2016-05-17 DIAGNOSIS — Z780 Asymptomatic menopausal state: Secondary | ICD-10-CM | POA: Diagnosis not present

## 2016-05-17 DIAGNOSIS — N951 Menopausal and female climacteric states: Secondary | ICD-10-CM

## 2016-05-18 DIAGNOSIS — J3089 Other allergic rhinitis: Secondary | ICD-10-CM | POA: Diagnosis not present

## 2016-05-18 DIAGNOSIS — J301 Allergic rhinitis due to pollen: Secondary | ICD-10-CM | POA: Diagnosis not present

## 2016-05-18 DIAGNOSIS — J3081 Allergic rhinitis due to animal (cat) (dog) hair and dander: Secondary | ICD-10-CM | POA: Diagnosis not present

## 2016-05-22 DIAGNOSIS — J3081 Allergic rhinitis due to animal (cat) (dog) hair and dander: Secondary | ICD-10-CM | POA: Diagnosis not present

## 2016-05-22 DIAGNOSIS — J301 Allergic rhinitis due to pollen: Secondary | ICD-10-CM | POA: Diagnosis not present

## 2016-05-22 DIAGNOSIS — J3089 Other allergic rhinitis: Secondary | ICD-10-CM | POA: Diagnosis not present

## 2016-05-23 ENCOUNTER — Other Ambulatory Visit: Payer: Self-pay | Admitting: Family Medicine

## 2016-05-23 DIAGNOSIS — R928 Other abnormal and inconclusive findings on diagnostic imaging of breast: Secondary | ICD-10-CM

## 2016-05-29 DIAGNOSIS — J3081 Allergic rhinitis due to animal (cat) (dog) hair and dander: Secondary | ICD-10-CM | POA: Diagnosis not present

## 2016-05-29 DIAGNOSIS — J301 Allergic rhinitis due to pollen: Secondary | ICD-10-CM | POA: Diagnosis not present

## 2016-05-29 DIAGNOSIS — J3089 Other allergic rhinitis: Secondary | ICD-10-CM | POA: Diagnosis not present

## 2016-05-30 ENCOUNTER — Ambulatory Visit
Admission: RE | Admit: 2016-05-30 | Discharge: 2016-05-30 | Disposition: A | Discharge status: Home / Self Care | Payer: BLUE CROSS/BLUE SHIELD | Source: Ambulatory Visit | Attending: Family Medicine | Admitting: Family Medicine

## 2016-05-30 DIAGNOSIS — Z23 Encounter for immunization: Secondary | ICD-10-CM | POA: Diagnosis not present

## 2016-05-30 DIAGNOSIS — F411 Generalized anxiety disorder: Secondary | ICD-10-CM | POA: Diagnosis not present

## 2016-05-30 DIAGNOSIS — E782 Mixed hyperlipidemia: Secondary | ICD-10-CM | POA: Diagnosis not present

## 2016-05-30 DIAGNOSIS — F331 Major depressive disorder, recurrent, moderate: Secondary | ICD-10-CM | POA: Diagnosis not present

## 2016-05-30 DIAGNOSIS — R928 Other abnormal and inconclusive findings on diagnostic imaging of breast: Secondary | ICD-10-CM

## 2016-05-30 DIAGNOSIS — R5383 Other fatigue: Secondary | ICD-10-CM | POA: Diagnosis not present

## 2016-05-30 DIAGNOSIS — K760 Fatty (change of) liver, not elsewhere classified: Secondary | ICD-10-CM | POA: Diagnosis not present

## 2016-05-31 ENCOUNTER — Other Ambulatory Visit: Payer: Self-pay | Admitting: Physician Assistant

## 2016-05-31 DIAGNOSIS — L720 Epidermal cyst: Secondary | ICD-10-CM | POA: Diagnosis not present

## 2016-05-31 DIAGNOSIS — D492 Neoplasm of unspecified behavior of bone, soft tissue, and skin: Secondary | ICD-10-CM | POA: Diagnosis not present

## 2016-06-08 DIAGNOSIS — J3081 Allergic rhinitis due to animal (cat) (dog) hair and dander: Secondary | ICD-10-CM | POA: Diagnosis not present

## 2016-06-08 DIAGNOSIS — J3089 Other allergic rhinitis: Secondary | ICD-10-CM | POA: Diagnosis not present

## 2016-06-08 DIAGNOSIS — J301 Allergic rhinitis due to pollen: Secondary | ICD-10-CM | POA: Diagnosis not present

## 2016-06-14 DIAGNOSIS — J301 Allergic rhinitis due to pollen: Secondary | ICD-10-CM | POA: Diagnosis not present

## 2016-06-14 DIAGNOSIS — J3081 Allergic rhinitis due to animal (cat) (dog) hair and dander: Secondary | ICD-10-CM | POA: Diagnosis not present

## 2016-06-14 DIAGNOSIS — J3089 Other allergic rhinitis: Secondary | ICD-10-CM | POA: Diagnosis not present

## 2016-06-22 DIAGNOSIS — J3089 Other allergic rhinitis: Secondary | ICD-10-CM | POA: Diagnosis not present

## 2016-06-22 DIAGNOSIS — J301 Allergic rhinitis due to pollen: Secondary | ICD-10-CM | POA: Diagnosis not present

## 2016-06-22 DIAGNOSIS — J3081 Allergic rhinitis due to animal (cat) (dog) hair and dander: Secondary | ICD-10-CM | POA: Diagnosis not present

## 2016-06-29 DIAGNOSIS — J3081 Allergic rhinitis due to animal (cat) (dog) hair and dander: Secondary | ICD-10-CM | POA: Diagnosis not present

## 2016-06-29 DIAGNOSIS — J301 Allergic rhinitis due to pollen: Secondary | ICD-10-CM | POA: Diagnosis not present

## 2016-06-29 DIAGNOSIS — J3089 Other allergic rhinitis: Secondary | ICD-10-CM | POA: Diagnosis not present

## 2016-07-05 ENCOUNTER — Other Ambulatory Visit: Payer: Self-pay | Admitting: Physician Assistant

## 2016-07-05 DIAGNOSIS — J3081 Allergic rhinitis due to animal (cat) (dog) hair and dander: Secondary | ICD-10-CM | POA: Diagnosis not present

## 2016-07-05 DIAGNOSIS — J3089 Other allergic rhinitis: Secondary | ICD-10-CM | POA: Diagnosis not present

## 2016-07-05 DIAGNOSIS — L723 Sebaceous cyst: Secondary | ICD-10-CM | POA: Diagnosis not present

## 2016-07-05 DIAGNOSIS — J301 Allergic rhinitis due to pollen: Secondary | ICD-10-CM | POA: Diagnosis not present

## 2016-07-09 DIAGNOSIS — Z1159 Encounter for screening for other viral diseases: Secondary | ICD-10-CM | POA: Diagnosis not present

## 2016-07-09 DIAGNOSIS — L659 Nonscarring hair loss, unspecified: Secondary | ICD-10-CM | POA: Diagnosis not present

## 2016-07-09 DIAGNOSIS — F411 Generalized anxiety disorder: Secondary | ICD-10-CM | POA: Diagnosis not present

## 2016-07-09 DIAGNOSIS — D508 Other iron deficiency anemias: Secondary | ICD-10-CM | POA: Diagnosis not present

## 2016-07-09 DIAGNOSIS — E559 Vitamin D deficiency, unspecified: Secondary | ICD-10-CM | POA: Diagnosis not present

## 2016-07-09 DIAGNOSIS — Z8041 Family history of malignant neoplasm of ovary: Secondary | ICD-10-CM | POA: Diagnosis not present

## 2016-07-12 DIAGNOSIS — J3081 Allergic rhinitis due to animal (cat) (dog) hair and dander: Secondary | ICD-10-CM | POA: Diagnosis not present

## 2016-07-12 DIAGNOSIS — J3089 Other allergic rhinitis: Secondary | ICD-10-CM | POA: Diagnosis not present

## 2016-07-12 DIAGNOSIS — J301 Allergic rhinitis due to pollen: Secondary | ICD-10-CM | POA: Diagnosis not present

## 2016-07-23 DIAGNOSIS — J3089 Other allergic rhinitis: Secondary | ICD-10-CM | POA: Diagnosis not present

## 2016-07-23 DIAGNOSIS — J301 Allergic rhinitis due to pollen: Secondary | ICD-10-CM | POA: Diagnosis not present

## 2016-07-23 DIAGNOSIS — J3081 Allergic rhinitis due to animal (cat) (dog) hair and dander: Secondary | ICD-10-CM | POA: Diagnosis not present

## 2016-08-06 ENCOUNTER — Encounter: Payer: Self-pay | Admitting: *Deleted

## 2016-08-06 DIAGNOSIS — J301 Allergic rhinitis due to pollen: Secondary | ICD-10-CM | POA: Diagnosis not present

## 2016-08-07 ENCOUNTER — Encounter: Payer: Self-pay | Admitting: Diagnostic Neuroimaging

## 2016-08-07 ENCOUNTER — Ambulatory Visit (INDEPENDENT_AMBULATORY_CARE_PROVIDER_SITE_OTHER): Payer: BLUE CROSS/BLUE SHIELD | Admitting: Diagnostic Neuroimaging

## 2016-08-07 VITALS — BP 129/77 | HR 86 | Wt 155.0 lb

## 2016-08-07 DIAGNOSIS — G4700 Insomnia, unspecified: Secondary | ICD-10-CM

## 2016-08-07 DIAGNOSIS — J3089 Other allergic rhinitis: Secondary | ICD-10-CM | POA: Diagnosis not present

## 2016-08-07 DIAGNOSIS — F902 Attention-deficit hyperactivity disorder, combined type: Secondary | ICD-10-CM | POA: Diagnosis not present

## 2016-08-07 DIAGNOSIS — R269 Unspecified abnormalities of gait and mobility: Secondary | ICD-10-CM

## 2016-08-07 DIAGNOSIS — F411 Generalized anxiety disorder: Secondary | ICD-10-CM

## 2016-08-07 DIAGNOSIS — M79604 Pain in right leg: Secondary | ICD-10-CM

## 2016-08-07 DIAGNOSIS — R413 Other amnesia: Secondary | ICD-10-CM | POA: Diagnosis not present

## 2016-08-07 DIAGNOSIS — F331 Major depressive disorder, recurrent, moderate: Secondary | ICD-10-CM | POA: Diagnosis not present

## 2016-08-07 DIAGNOSIS — J3081 Allergic rhinitis due to animal (cat) (dog) hair and dander: Secondary | ICD-10-CM | POA: Diagnosis not present

## 2016-08-07 NOTE — Progress Notes (Signed)
[facility]  PATIENT: [name] [name] DOB: 30-Nov-1955  REFERRING CLINICIAN: Margarita Grizzle, NP HISTORY FROM: patient  REASON FOR VISIT: new consult    HISTORICAL  CHIEF COMPLAINT:  Chief Complaint  Patient presents with  . Memory difficulties    rm 7, New Pt, MMSE 30    HISTORY OF PRESENT ILLNESS:   61 year old female with ADHD, depression, fatty liver disease, here for evaluation of memory difficulties. Patient reports lifelong history of ADHD problems, more recent problems with depression, and now increasing problems with word finding difficulty, short-term memory loss, difficulty with concentration. Patient was diagnosed officially with ADHD and depression in the year 1998-10-13 at the [place] the ADHD clinic. Around that time she was started on medication. Since 2008-10-12 her symptoms have increasingly worsened. Patient has increasing stress related to her sister passing away 2013-10-12, mother passed away October 12, 2013, her responsibility of handling family states, and other family conflicts.  Patient also having increasing dizziness and weight loss since 13-Oct-2015.  Patient reports history of multiple courses evaluation almost 20 years ago due to presence of "spots in the brain" but had a spinal tap and was found to have no oligoclonal bands. Therefore multiple sclerosis was ruled out at that time.    REVIEW OF SYSTEMS: Full 14 system review of systems performed and negative with exception of: Weight loss fatigue chest pain hearing loss allergies dizziness memory loss confusion insomnia loss of vision.  ALLERGIES: Allergies  Allergen Reactions  . Sulfa Antibiotics Other (See Comments)    Flu-like symptoms  . Augmentin [Amoxicillin-Pot Clavulanate] Rash    HOME MEDICATIONS: Outpatient Medications Prior to Visit  Medication Sig Dispense Refill  . amphetamine-dextroamphetamine (ADDERALL) 20 MG tablet Take 20 mg by mouth 3 (three) times daily as needed. Pt takes up to three times a day for  ADD    . buPROPion (WELLBUTRIN SR) 150 MG 12 hr tablet Take 150 mg by mouth 2 (two) times daily.    Marland Kitchen levothyroxine (SYNTHROID, LEVOTHROID) 100 MCG tablet Take 100 mcg by mouth daily before breakfast.    . Multiple Vitamin (MULTIVITAMIN WITH MINERALS) TABS Take 1 tablet by mouth daily.    Marland Kitchen amoxicillin-clavulanate (AUGMENTIN) 875-125 MG per tablet Take 1 tablet by mouth 2 (two) times daily. 20 tablet 0  . cephALEXin (KEFLEX) 500 MG capsule Take 1 capsule (500 mg total) by mouth 3 (three) times daily. 30 capsule 0  . escitalopram (LEXAPRO) 20 MG tablet Take 20 mg by mouth daily.    . lansoprazole (PREVACID) 15 MG capsule Take 1 capsule (15 mg total) by mouth daily at 12 noon. 30 capsule 0   No facility-administered medications prior to visit.     PAST MEDICAL HISTORY: Past Medical History:  Diagnosis Date  . ADD (attention deficit disorder)   . Anemia   . Anxiety   . Depression   . Seasonal allergies   . Shortness of breath    due to anemia  . URI (upper respiratory infection)    current cough and head congestion- no fever  . Vitamin D deficiency     PAST SURGICAL HISTORY: Past Surgical History:  Procedure Laterality Date  . back cysts removed     multiple  removed x 4  . BACK SURGERY  10/13/07   Hemi laminectomy  . DIAGNOSTIC LAPAROSCOPY    . DILATION AND CURETTAGE OF UTERUS  13-Oct-2011   hysteroscopy resect polyp  . WISDOM TOOTH EXTRACTION      FAMILY HISTORY: Family History  Problem Relation Age of Onset  . Cancer Father   . Cancer Sister   . Cancer Brother   . Cancer Paternal Uncle     SOCIAL HISTORY:  Social History   Social History  . Marital status: Married    Spouse name: N/A  . Number of children: 0  . Years of education: 17   Occupational History  .      unemployed   Social History Main Topics  . Smoking status: Current Some Day Smoker    Packs/day: 0.50    Years: 15.00    Types: Cigarettes  . Smokeless tobacco: Never Used     Comment: 08/07/16 maybe  1 cig/day  . Alcohol use 1.2 oz/week    2 Cans of beer per week     Comment: daily  . Drug use: No     Comment: quit 2016  . Sexual activity: No   Other Topics Concern  . Not on file   Social History Narrative   Lives with spouse   Caffeine- coffee, 2 big cups daily     PHYSICAL EXAM  GENERAL EXAM/CONSTITUTIONAL: Vitals:  Vitals:   08/07/16 1036  BP: 129/77  Pulse: 86  Weight: 155 lb (70.3 kg)     Body mass index is 25.02 kg/m.  Visual Acuity Screening   Right eye Left eye Both eyes  Without correction:     With correction: 20/30 20/20      Patient is in no distress; well developed, nourished and groomed; neck is supple  CARDIOVASCULAR:  Examination of carotid arteries is normal; no carotid bruits  Regular rate and rhythm, no murmurs  Examination of peripheral vascular system by observation and palpation is normal  EYES:  Ophthalmoscopic exam of optic discs and posterior segments is normal; no papilledema or hemorrhages  MUSCULOSKELETAL:  Gait, strength, tone, movements noted in Neurologic exam below  NEUROLOGIC: MENTAL STATUS:  MMSE - Mini Mental State Exam 08/07/2016  Orientation to time 5  Orientation to Place 5  Registration 3  Attention/ Calculation 5  Recall 3  Language- name 2 objects 2  Language- repeat 1  Language- follow 3 step command 3  Language- read & follow direction 1  Write a sentence 1  Copy design 1  Total score 30    awake, alert, oriented to person, place and time  recent and remote memory intact  normal attention and concentration  language fluent, comprehension intact, naming intact,   fund of knowledge appropriate  CRANIAL NERVE:   2nd - no papilledema on fundoscopic exam  2nd, 3rd, 4th, 6th - pupils equal and reactive to light, visual fields full to confrontation, extraocular muscles intact, no nystagmus  5th - facial sensation symmetric  7th - facial strength symmetric  8th - hearing intact  9th -  palate elevates symmetrically, uvula midline  11th - shoulder shrug symmetric  12th - tongue protrusion midline  MOTOR:   normal bulk and tone, full strength in the BUE, BLE  SENSORY:   normal and symmetric to light touch, pinprick, temperature, vibration  COORDINATION:   finger-nose-finger, fine finger movements normal  REFLEXES:   deep tendon reflexes present and symmetric  GAIT/STATION:   narrow based gait; SLIGTH DIFF WITH TANDEM; able to walk on toes, heels; romberg is negative    DIAGNOSTIC DATA (LABS, IMAGING, TESTING) - I reviewed patient records, labs, notes, testing and imaging myself where available.  Lab Results  Component Value Date   WBC 8.6 01/11/2015  HGB 16.1 (H) 01/11/2015   HCT 46.8 (H) 01/11/2015   MCV 91.4 01/11/2015   PLT 378 01/11/2015      Component Value Date/Time   NA 139 01/11/2015 1732   K 4.1 01/11/2015 1732   CL 105 01/11/2015 1732   CO2 26 01/11/2015 1732   GLUCOSE 107 (H) 01/11/2015 1732   BUN 20 01/11/2015 1732   CREATININE 0.89 01/11/2015 1732   CALCIUM 10.0 01/11/2015 1732   PROT 8.2 (H) 01/11/2015 1732   ALBUMIN 4.4 01/11/2015 1732   AST 69 (H) 01/11/2015 1732   ALT 104 (H) 01/11/2015 1732   ALKPHOS 110 01/11/2015 1732   BILITOT 0.7 01/11/2015 1732   GFRNONAA >60 01/11/2015 1732   GFRAA >60 01/11/2015 1732   Lab Results  Component Value Date   CHOL 242 (HH) 07/24/2006   HDL 46.6 07/24/2006   LDLDIRECT 180.1 07/24/2006   TRIG 137 07/24/2006   CHOLHDL 5.2 CALC 07/24/2006   No results found for: HGBA1C No results found for: VITAMINB12 Lab Results  Component Value Date   TSH 1.95 07/24/2006        ASSESSMENT AND PLAN  61 y.o. year old female here with worsening memory problems, attention difficulty, depression, family stress.   Ddx: ADHD, depression, pseudo-dementia, cerebro-vascular disease, CNS autoimmune/inflamm  1. Memory loss   2. Attention deficit hyperactivity disorder (ADHD), combined type     3. Moderate episode of recurrent major depressive disorder ([place])   4. Insomnia, unspecified type   5. Generalized anxiety disorder      PLAN: - check MRI brain  - continue ADHD and depression treatments per psychiatry (Dr. Toy Care) - brain health activities - PT evaluation for gait, balance training  Orders Placed This Encounter  Procedures  . MR BRAIN W WO CONTRAST  . Ambulatory referral to Physical Therapy   Return in about 3 months (around 11/04/2016).    Penni Bombard, MD [id number], [id number] AM Certified in Neurology, Neurophysiology and Neuroimaging  [facility] [address], [place], [place] [address] [phone]

## 2016-08-07 NOTE — Patient Instructions (Signed)
Thank you for coming to see [place] at [facility]. I hope we have been able to provide you high quality care today.  You may receive a patient satisfaction survey over the next few weeks. We would appreciate your feedback and comments so that we may continue to improve ourselves and the health of our patients.  - check MRI brain   - I will setup physical therapy  - continue brain healthy activities   ~~~~~~~~~~~~~~~~~~~~~~~~~~~~~~~~~~~~~~~~~~~~~~~~~~~~~~~~~~~~~~~~~  DR. PENUMALLI'S GUIDE TO HAPPY AND HEALTHY LIVING These are some of my general health and wellness recommendations. Some of them may apply to you better than others. Please use common sense as you try these suggestions and feel free to ask me any questions.   ACTIVITY/FITNESS Mental, social, emotional and physical stimulation are very important for brain and body health. Try learning a new activity (arts, music, language, sports, games).  Keep moving your body to the best of your abilities. You can do this at home, inside or outside, the park, community center, gym or anywhere you like. Consider a physical therapist or personal trainer to get started. Consider the app Sworkit. Fitness trackers such as smart-watches, smart-phones or Fitbits can help as well.   NUTRITION Eat more plants: colorful vegetables, nuts, seeds and berries.  Eat less sugar, salt, preservatives and processed foods.  Avoid toxins such as cigarettes and alcohol.  Drink water when you are thirsty. Warm water with a slice of lemon is an excellent morning drink to start the day.  Consider these websites for more information The Nutrition Source (https://www.henry-hernandez.biz/) Precision Nutrition (WindowBlog.ch)   RELAXATION Consider practicing mindfulness meditation or other relaxation techniques such as deep breathing, prayer, yoga, tai chi, massage. See website mindful.org or the apps  Headspace or Calm to help get started.   SLEEP Try to get at least 7-8+ hours sleep per day. Regular exercise and reduced caffeine will help you sleep better. Practice good sleep hygeine techniques. See website sleep.org for more information.   PLANNING Prepare estate planning, living will, healthcare POA documents. Sometimes this is best planned with the help of an attorney. Theconversationproject.org and agingwithdignity.org are excellent resources.

## 2016-08-09 ENCOUNTER — Telehealth: Payer: Self-pay | Admitting: Diagnostic Neuroimaging

## 2016-08-09 NOTE — Telephone Encounter (Signed)
I called the patient to talk to her about her MRI and that I sent her MRI it Gi and that if she didn't hear anything from them in the next couple days to call [phone]. But she also informed me that she spoke to Dr. Leta Baptist about getting physical therapy and she didn't know if an authorization was needed for that.

## 2016-08-09 NOTE — Telephone Encounter (Signed)
LVM informing patient that the PT dept will check into her insurance authorization if needed. Gave her phone number to [facility].

## 2016-08-10 DIAGNOSIS — J301 Allergic rhinitis due to pollen: Secondary | ICD-10-CM | POA: Diagnosis not present

## 2016-08-10 DIAGNOSIS — J3089 Other allergic rhinitis: Secondary | ICD-10-CM | POA: Diagnosis not present

## 2016-08-10 DIAGNOSIS — J3081 Allergic rhinitis due to animal (cat) (dog) hair and dander: Secondary | ICD-10-CM | POA: Diagnosis not present

## 2016-08-14 DIAGNOSIS — J3089 Other allergic rhinitis: Secondary | ICD-10-CM | POA: Diagnosis not present

## 2016-08-14 DIAGNOSIS — J3081 Allergic rhinitis due to animal (cat) (dog) hair and dander: Secondary | ICD-10-CM | POA: Diagnosis not present

## 2016-08-14 DIAGNOSIS — J301 Allergic rhinitis due to pollen: Secondary | ICD-10-CM | POA: Diagnosis not present

## 2016-08-15 ENCOUNTER — Ambulatory Visit
Admission: RE | Admit: 2016-08-15 | Discharge: 2016-08-15 | Disposition: A | Discharge status: Home / Self Care | Payer: BLUE CROSS/BLUE SHIELD | Source: Ambulatory Visit | Attending: Diagnostic Neuroimaging | Admitting: Diagnostic Neuroimaging

## 2016-08-15 DIAGNOSIS — F331 Major depressive disorder, recurrent, moderate: Secondary | ICD-10-CM

## 2016-08-15 DIAGNOSIS — F411 Generalized anxiety disorder: Secondary | ICD-10-CM

## 2016-08-15 DIAGNOSIS — G4700 Insomnia, unspecified: Secondary | ICD-10-CM

## 2016-08-15 DIAGNOSIS — F902 Attention-deficit hyperactivity disorder, combined type: Secondary | ICD-10-CM

## 2016-08-15 DIAGNOSIS — R413 Other amnesia: Secondary | ICD-10-CM | POA: Diagnosis not present

## 2016-08-20 ENCOUNTER — Telehealth: Payer: Self-pay | Admitting: *Deleted

## 2016-08-20 NOTE — Telephone Encounter (Signed)
Per Dr Rexene Alberts,  Spoke with patient and gave her Dr Guadelupe Sabin message re: MRI brain results. Patient stated that her only issue is usually with cholesterol.  Reminded her of her follow up in May. She verbalized understanding, appreciation of call.

## 2016-08-20 NOTE — Telephone Encounter (Signed)
Please call patient regarding the recent brain MRI from 08/15/2016, with and without contrast: The brain scan showed a normal structure of the brain and no significant volume loss which we call atrophy. There were changes in the deeper structures of the brain, which we call white matter changes or microvascular changes. These were reported as mild in Her case. These are tiny white spots, that occur with time and are seen in a variety of conditions, including with normal aging, chronic hypertension, chronic headaches, especially migraine HAs, chronic diabetes, chronic hyperlipidemia. These are not strokes and no mass or lesion or contrast enhancement was seen which is reassuring. Again, there were no acute findings, no findings out of the usual for her age, and no stroke, or mass or blood products or abnormal contrast uptake. No further action is required on this test at this time, other than re-enforcing the importance of good blood pressure control, good cholesterol control, good blood sugar control, and weight management. FU and management as discussed by Dr. Leta Baptist.

## 2016-08-22 DIAGNOSIS — F411 Generalized anxiety disorder: Secondary | ICD-10-CM | POA: Diagnosis not present

## 2016-08-22 DIAGNOSIS — F900 Attention-deficit hyperactivity disorder, predominantly inattentive type: Secondary | ICD-10-CM | POA: Diagnosis not present

## 2016-08-22 DIAGNOSIS — F3342 Major depressive disorder, recurrent, in full remission: Secondary | ICD-10-CM | POA: Diagnosis not present

## 2016-08-24 DIAGNOSIS — J3089 Other allergic rhinitis: Secondary | ICD-10-CM | POA: Diagnosis not present

## 2016-08-24 DIAGNOSIS — J3081 Allergic rhinitis due to animal (cat) (dog) hair and dander: Secondary | ICD-10-CM | POA: Diagnosis not present

## 2016-08-24 DIAGNOSIS — J301 Allergic rhinitis due to pollen: Secondary | ICD-10-CM | POA: Diagnosis not present

## 2016-08-29 DIAGNOSIS — J3081 Allergic rhinitis due to animal (cat) (dog) hair and dander: Secondary | ICD-10-CM | POA: Diagnosis not present

## 2016-08-29 DIAGNOSIS — J301 Allergic rhinitis due to pollen: Secondary | ICD-10-CM | POA: Diagnosis not present

## 2016-08-29 DIAGNOSIS — J3089 Other allergic rhinitis: Secondary | ICD-10-CM | POA: Diagnosis not present

## 2016-08-29 NOTE — Telephone Encounter (Signed)
Pt called asked why she had to wait 3 mths to be seen again. Pt says she wants to be seen sooner. Please call

## 2016-08-30 NOTE — Telephone Encounter (Signed)
LVM informing patient that she may be seen sooner than May. Advised her this RN wanted to inquire if she was having new issues, or worsening of problems. Advised her the phone staff may reschedule for the end of March is there is availability. Left number and stated she may ask to speak to this RN if needed.

## 2016-09-07 DIAGNOSIS — J3089 Other allergic rhinitis: Secondary | ICD-10-CM | POA: Diagnosis not present

## 2016-09-07 DIAGNOSIS — J3081 Allergic rhinitis due to animal (cat) (dog) hair and dander: Secondary | ICD-10-CM | POA: Diagnosis not present

## 2016-09-07 DIAGNOSIS — J301 Allergic rhinitis due to pollen: Secondary | ICD-10-CM | POA: Diagnosis not present

## 2016-09-21 DIAGNOSIS — J301 Allergic rhinitis due to pollen: Secondary | ICD-10-CM | POA: Diagnosis not present

## 2016-09-21 DIAGNOSIS — J3089 Other allergic rhinitis: Secondary | ICD-10-CM | POA: Diagnosis not present

## 2016-09-21 DIAGNOSIS — J3081 Allergic rhinitis due to animal (cat) (dog) hair and dander: Secondary | ICD-10-CM | POA: Diagnosis not present

## 2016-10-05 DIAGNOSIS — J3089 Other allergic rhinitis: Secondary | ICD-10-CM | POA: Diagnosis not present

## 2016-10-05 DIAGNOSIS — J3081 Allergic rhinitis due to animal (cat) (dog) hair and dander: Secondary | ICD-10-CM | POA: Diagnosis not present

## 2016-10-05 DIAGNOSIS — J301 Allergic rhinitis due to pollen: Secondary | ICD-10-CM | POA: Diagnosis not present

## 2016-10-09 DIAGNOSIS — F411 Generalized anxiety disorder: Secondary | ICD-10-CM | POA: Diagnosis not present

## 2016-10-09 DIAGNOSIS — J309 Allergic rhinitis, unspecified: Secondary | ICD-10-CM | POA: Diagnosis not present

## 2016-10-09 DIAGNOSIS — J301 Allergic rhinitis due to pollen: Secondary | ICD-10-CM | POA: Diagnosis not present

## 2016-10-09 DIAGNOSIS — J3081 Allergic rhinitis due to animal (cat) (dog) hair and dander: Secondary | ICD-10-CM | POA: Diagnosis not present

## 2016-10-09 DIAGNOSIS — E039 Hypothyroidism, unspecified: Secondary | ICD-10-CM | POA: Diagnosis not present

## 2016-10-09 DIAGNOSIS — Z0000 Encounter for general adult medical examination without abnormal findings: Secondary | ICD-10-CM | POA: Diagnosis not present

## 2016-10-09 DIAGNOSIS — J3089 Other allergic rhinitis: Secondary | ICD-10-CM | POA: Diagnosis not present

## 2016-10-18 DIAGNOSIS — J301 Allergic rhinitis due to pollen: Secondary | ICD-10-CM | POA: Diagnosis not present

## 2016-10-18 DIAGNOSIS — J3089 Other allergic rhinitis: Secondary | ICD-10-CM | POA: Diagnosis not present

## 2016-10-18 DIAGNOSIS — J3081 Allergic rhinitis due to animal (cat) (dog) hair and dander: Secondary | ICD-10-CM | POA: Diagnosis not present

## 2016-10-24 ENCOUNTER — Ambulatory Visit (INDEPENDENT_AMBULATORY_CARE_PROVIDER_SITE_OTHER): Payer: BLUE CROSS/BLUE SHIELD | Admitting: Diagnostic Neuroimaging

## 2016-10-24 ENCOUNTER — Encounter: Payer: Self-pay | Admitting: Diagnostic Neuroimaging

## 2016-10-24 ENCOUNTER — Encounter (INDEPENDENT_AMBULATORY_CARE_PROVIDER_SITE_OTHER): Payer: Self-pay

## 2016-10-24 VITALS — BP 129/86 | HR 82 | Ht 64.0 in | Wt 152.8 lb

## 2016-10-24 DIAGNOSIS — R930 Abnormal findings on diagnostic imaging of skull and head, not elsewhere classified: Secondary | ICD-10-CM

## 2016-10-24 DIAGNOSIS — F902 Attention-deficit hyperactivity disorder, combined type: Secondary | ICD-10-CM

## 2016-10-24 DIAGNOSIS — R413 Other amnesia: Secondary | ICD-10-CM | POA: Diagnosis not present

## 2016-10-24 DIAGNOSIS — R9082 White matter disease, unspecified: Secondary | ICD-10-CM

## 2016-10-24 NOTE — Progress Notes (Signed)
[facility]  PATIENT: [name] [name] DOB: 01-01-1956  REFERRING CLINICIAN: Margarita Grizzle, NP HISTORY FROM: patient  REASON FOR VISIT: follow up    HISTORICAL  CHIEF COMPLAINT:  Chief Complaint  Patient presents with  . Follow-up  . Memory Loss    Diagnosed with osteopenia,     HISTORY OF PRESENT ILLNESS:   UPDATE 10/24/16: Since last visit, symptoms are stable to slightly improved. MRI reviewed. LDL still very high. Also found to have slightly high calcium. Did not see PT yet.  PRIOR HPI (08/07/16): 61 year old female with ADHD, depression, fatty liver disease, here for evaluation of memory difficulties. Patient reports lifelong history of ADHD problems, more recent problems with depression, and now increasing problems with word finding difficulty, short-term memory loss, difficulty with concentration. Patient was diagnosed officially with ADHD and depression in the year 1998/10/18 at the [place] the ADHD clinic. Around that time she was started on medication. Since 10/17/08 her symptoms have increasingly worsened. Patient has increasing stress related to her sister passing away 10-17-2013, mother passed away Oct 17, 2013, her responsibility of handling family states, and other family conflicts. Patient also having increasing dizziness and weight loss since Oct 18, 2015. Patient reports history of multiple courses evaluation almost 20 years ago due to presence of "spots in the brain" but had a spinal tap and was found to have no oligoclonal bands. Therefore multiple sclerosis was ruled out at that time.   REVIEW OF SYSTEMS: Full 14 system review of systems performed and negative with exception of: freq urination unexpected wt change emotional lability cold intolerance.   ALLERGIES: Allergies  Allergen Reactions  . Sulfa Antibiotics Other (See Comments)    Flu-like symptoms  . Augmentin [Amoxicillin-Pot Clavulanate] Rash    HOME MEDICATIONS: Outpatient Medications Prior to Visit  Medication Sig  Dispense Refill  . amphetamine-dextroamphetamine (ADDERALL) 20 MG tablet Take 20 mg by mouth 3 (three) times daily as needed. Pt takes up to three times a day for ADD    . buPROPion (WELLBUTRIN SR) 150 MG 12 hr tablet Take 150 mg by mouth daily.     Marland Kitchen levothyroxine (SYNTHROID, LEVOTHROID) 100 MCG tablet Take 100 mcg by mouth daily before breakfast.    . LORazepam (ATIVAN) 1 MG tablet Takes .25 mg nightly    . montelukast (SINGULAIR) 10 MG tablet 10 mg daily.    . Multiple Vitamin (MULTIVITAMIN WITH MINERALS) TABS Take 1 tablet by mouth daily.    . traZODone (DESYREL) 50 MG tablet 50 mg at bedtime.     No facility-administered medications prior to visit.     PAST MEDICAL HISTORY: Past Medical History:  Diagnosis Date  . ADD (attention deficit disorder)   . Anemia   . Anxiety   . Depression   . Osteopenia   . Seasonal allergies   . Shortness of breath    due to anemia  . URI (upper respiratory infection)    current cough and head congestion- no fever  . Vitamin D deficiency     PAST SURGICAL HISTORY: Past Surgical History:  Procedure Laterality Date  . back cysts removed     multiple  removed x 4  . BACK SURGERY  Oct 18, 2007   Hemi laminectomy  . DIAGNOSTIC LAPAROSCOPY    . DILATION AND CURETTAGE OF UTERUS  10-18-2011   hysteroscopy resect polyp  . WISDOM TOOTH EXTRACTION      FAMILY HISTORY: Family History  Problem Relation Age of Onset  . Cancer Father   . Cancer  Sister   . Cancer Brother   . Cancer Paternal Uncle     SOCIAL HISTORY:  Social History   Social History  . Marital status: Married    Spouse name: Richard  . Number of children: 0  . Years of education: 31   Occupational History  .      unemployed   Social History Main Topics  . Smoking status: Current Some Day Smoker    Packs/day: 0.50    Years: 15.00    Types: Cigarettes  . Smokeless tobacco: Never Used     Comment: 08/07/16 maybe 1 cig/day  . Alcohol use 1.2 oz/week    2 Cans of beer per week      Comment: daily  . Drug use: No     Comment: quit 2016  . Sexual activity: No   Other Topics Concern  . Not on file   Social History Narrative   Lives with spouse   Caffeine- coffee, 2 big cups daily     PHYSICAL EXAM  GENERAL EXAM/CONSTITUTIONAL: Vitals:  Vitals:   10/24/16 1042  BP: 129/86  Pulse: 82  Weight: 152 lb 12.8 oz (69.3 kg)  Height: 5\' 4"  (1.626 m)   Body mass index is 26.23 kg/m. No exam data present  Patient is in no distress; well developed, nourished and groomed; neck is supple  CARDIOVASCULAR:  Examination of carotid arteries is normal; no carotid bruits  Regular rate and rhythm, no murmurs  Examination of peripheral vascular system by observation and palpation is normal  EYES:  Ophthalmoscopic exam of optic discs and posterior segments is normal; no papilledema or hemorrhages  MUSCULOSKELETAL:  Gait, strength, tone, movements noted in Neurologic exam below  NEUROLOGIC: MENTAL STATUS:  MMSE - [place] Exam 10/24/2016 08/07/2016  Orientation to time 4 5  Orientation to Place 5 5  Registration 3 3  Attention/ Calculation 5 5  Recall 3 3  Language- name 2 objects 2 2  Language- repeat 1 1  Language- follow 3 step command 2 3  Language- read & follow direction 1 1  Write a sentence 1 1  Copy design 1 1  Total score 28 30    awake, alert, oriented to person, place and time  recent and remote memory intact  normal attention and concentration  language fluent, comprehension intact, naming intact,   fund of knowledge appropriate  CRANIAL NERVE:   2nd - no papilledema on fundoscopic exam  2nd, 3rd, 4th, 6th - pupils equal and reactive to light, visual fields full to confrontation, extraocular muscles intact, no nystagmus  5th - facial sensation symmetric  7th - facial strength symmetric  8th - hearing intact  9th - palate elevates symmetrically, uvula midline  11th - shoulder shrug symmetric  12th - tongue  protrusion midline  MOTOR:   normal bulk and tone, full strength in the BUE, BLE  SENSORY:   normal and symmetric to light touch  COORDINATION:   finger-nose-finger, fine finger movements normal  REFLEXES:   deep tendon reflexes present and symmetric  GAIT/STATION:   narrow based gait; ABLE TO WALK TANDEM; romberg is negative    DIAGNOSTIC DATA (LABS, IMAGING, TESTING) - I reviewed patient records, labs, notes, testing and imaging myself where available.  Lab Results  Component Value Date   WBC 8.6 01/11/2015   HGB 16.1 (H) 01/11/2015   HCT 46.8 (H) 01/11/2015   MCV 91.4 01/11/2015   PLT 378 01/11/2015      Component  Value Date/Time   NA 139 01/11/2015 1732   K 4.1 01/11/2015 1732   CL 105 01/11/2015 1732   CO2 26 01/11/2015 1732   GLUCOSE 107 (H) 01/11/2015 1732   BUN 20 01/11/2015 1732   CREATININE 0.89 01/11/2015 1732   CALCIUM 10.0 01/11/2015 1732   PROT 8.2 (H) 01/11/2015 1732   ALBUMIN 4.4 01/11/2015 1732   AST 69 (H) 01/11/2015 1732   ALT 104 (H) 01/11/2015 1732   ALKPHOS 110 01/11/2015 1732   BILITOT 0.7 01/11/2015 1732   GFRNONAA >60 01/11/2015 1732   GFRAA >60 01/11/2015 1732   Lab Results  Component Value Date   CHOL 242 (HH) 07/24/2006   HDL 46.6 07/24/2006   LDLDIRECT 180.1 07/24/2006   TRIG 137 07/24/2006   CHOLHDL 5.2 CALC 07/24/2006   No results found for: HGBA1C No results found for: VITAMINB12 Lab Results  Component Value Date   TSH 1.95 07/24/2006    08/15/16 MRI brain [I reviewed images myself and agree with interpretation. Likely chronic small vessel ischemic disease. -VRP]  - abnormal MRI scan of the brain showing scattered periventricular and subcortical nonspecific white matter hyperintensities on T2/flair images which are nonspecific with the differential discussed above. No enhancing lesions are noted.     ASSESSMENT AND PLAN  61 y.o. year old female here with worsening memory problems, attention difficulty,  depression, family stress.   Ddx: ADHD, depression, pseudo-dementia, cerebro-vascular disease, CNS autoimmune/inflamm  1. Memory loss   2. Attention deficit hyperactivity disorder (ADHD), combined type   3. White matter abnormality on MRI of brain      PLAN: I spent 25 minutes of face to face time with patient. Greater than 50% of time was spent in counseling and coordination of care with patient. In summary we discussed: - continue ADHD and depression treatments per psychiatry (Dr. Toy Care) - brain health activities - PT evaluation for gait, balance training - repeat MRI brain in 1 year  Return in about 9 months (around 07/26/2017).    Penni Bombard, MD 3/00/7622, 63:33 AM Certified in Neurology, Neurophysiology and Neuroimaging  [facility] [address] [place],  [address] [phone]

## 2016-10-24 NOTE — Patient Instructions (Signed)
-   follow up in 9 months  - repeat MRI brain in 1 year

## 2016-10-25 DIAGNOSIS — J3089 Other allergic rhinitis: Secondary | ICD-10-CM | POA: Diagnosis not present

## 2016-10-25 DIAGNOSIS — J3081 Allergic rhinitis due to animal (cat) (dog) hair and dander: Secondary | ICD-10-CM | POA: Diagnosis not present

## 2016-10-25 DIAGNOSIS — J301 Allergic rhinitis due to pollen: Secondary | ICD-10-CM | POA: Diagnosis not present

## 2016-11-01 DIAGNOSIS — J3089 Other allergic rhinitis: Secondary | ICD-10-CM | POA: Diagnosis not present

## 2016-11-01 DIAGNOSIS — J301 Allergic rhinitis due to pollen: Secondary | ICD-10-CM | POA: Diagnosis not present

## 2016-11-01 DIAGNOSIS — J3081 Allergic rhinitis due to animal (cat) (dog) hair and dander: Secondary | ICD-10-CM | POA: Diagnosis not present

## 2016-11-06 DIAGNOSIS — H938X3 Other specified disorders of ear, bilateral: Secondary | ICD-10-CM | POA: Diagnosis not present

## 2016-11-06 DIAGNOSIS — J343 Hypertrophy of nasal turbinates: Secondary | ICD-10-CM | POA: Diagnosis not present

## 2016-11-06 DIAGNOSIS — H903 Sensorineural hearing loss, bilateral: Secondary | ICD-10-CM | POA: Diagnosis not present

## 2016-11-06 DIAGNOSIS — F17210 Nicotine dependence, cigarettes, uncomplicated: Secondary | ICD-10-CM | POA: Diagnosis not present

## 2016-11-08 DIAGNOSIS — J301 Allergic rhinitis due to pollen: Secondary | ICD-10-CM | POA: Diagnosis not present

## 2016-11-08 DIAGNOSIS — J3081 Allergic rhinitis due to animal (cat) (dog) hair and dander: Secondary | ICD-10-CM | POA: Diagnosis not present

## 2016-11-08 DIAGNOSIS — J3089 Other allergic rhinitis: Secondary | ICD-10-CM | POA: Diagnosis not present

## 2016-11-15 DIAGNOSIS — J301 Allergic rhinitis due to pollen: Secondary | ICD-10-CM | POA: Diagnosis not present

## 2016-11-15 DIAGNOSIS — J3089 Other allergic rhinitis: Secondary | ICD-10-CM | POA: Diagnosis not present

## 2016-11-15 DIAGNOSIS — J3081 Allergic rhinitis due to animal (cat) (dog) hair and dander: Secondary | ICD-10-CM | POA: Diagnosis not present

## 2016-11-20 ENCOUNTER — Ambulatory Visit: Payer: BLUE CROSS/BLUE SHIELD | Admitting: Diagnostic Neuroimaging

## 2016-11-21 DIAGNOSIS — Z6825 Body mass index (BMI) 25.0-25.9, adult: Secondary | ICD-10-CM | POA: Diagnosis not present

## 2016-11-21 DIAGNOSIS — Z01419 Encounter for gynecological examination (general) (routine) without abnormal findings: Secondary | ICD-10-CM | POA: Diagnosis not present

## 2016-11-22 DIAGNOSIS — J3081 Allergic rhinitis due to animal (cat) (dog) hair and dander: Secondary | ICD-10-CM | POA: Diagnosis not present

## 2016-11-22 DIAGNOSIS — J3089 Other allergic rhinitis: Secondary | ICD-10-CM | POA: Diagnosis not present

## 2016-11-22 DIAGNOSIS — J301 Allergic rhinitis due to pollen: Secondary | ICD-10-CM | POA: Diagnosis not present

## 2016-11-30 DIAGNOSIS — J301 Allergic rhinitis due to pollen: Secondary | ICD-10-CM | POA: Diagnosis not present

## 2016-11-30 DIAGNOSIS — J3089 Other allergic rhinitis: Secondary | ICD-10-CM | POA: Diagnosis not present

## 2016-11-30 DIAGNOSIS — J3081 Allergic rhinitis due to animal (cat) (dog) hair and dander: Secondary | ICD-10-CM | POA: Diagnosis not present

## 2016-12-10 DIAGNOSIS — J3089 Other allergic rhinitis: Secondary | ICD-10-CM | POA: Diagnosis not present

## 2016-12-10 DIAGNOSIS — J301 Allergic rhinitis due to pollen: Secondary | ICD-10-CM | POA: Diagnosis not present

## 2016-12-20 DIAGNOSIS — M8580 Other specified disorders of bone density and structure, unspecified site: Secondary | ICD-10-CM | POA: Diagnosis not present

## 2016-12-20 DIAGNOSIS — E039 Hypothyroidism, unspecified: Secondary | ICD-10-CM | POA: Diagnosis not present

## 2016-12-21 DIAGNOSIS — J301 Allergic rhinitis due to pollen: Secondary | ICD-10-CM | POA: Diagnosis not present

## 2016-12-21 DIAGNOSIS — J3089 Other allergic rhinitis: Secondary | ICD-10-CM | POA: Diagnosis not present

## 2016-12-21 DIAGNOSIS — J3081 Allergic rhinitis due to animal (cat) (dog) hair and dander: Secondary | ICD-10-CM | POA: Diagnosis not present

## 2016-12-28 DIAGNOSIS — J3089 Other allergic rhinitis: Secondary | ICD-10-CM | POA: Diagnosis not present

## 2016-12-28 DIAGNOSIS — J301 Allergic rhinitis due to pollen: Secondary | ICD-10-CM | POA: Diagnosis not present

## 2016-12-28 DIAGNOSIS — J3081 Allergic rhinitis due to animal (cat) (dog) hair and dander: Secondary | ICD-10-CM | POA: Diagnosis not present

## 2017-01-03 DIAGNOSIS — J3089 Other allergic rhinitis: Secondary | ICD-10-CM | POA: Diagnosis not present

## 2017-01-03 DIAGNOSIS — J301 Allergic rhinitis due to pollen: Secondary | ICD-10-CM | POA: Diagnosis not present

## 2017-01-03 DIAGNOSIS — J3081 Allergic rhinitis due to animal (cat) (dog) hair and dander: Secondary | ICD-10-CM | POA: Diagnosis not present

## 2017-01-11 DIAGNOSIS — J301 Allergic rhinitis due to pollen: Secondary | ICD-10-CM | POA: Diagnosis not present

## 2017-01-11 DIAGNOSIS — J3081 Allergic rhinitis due to animal (cat) (dog) hair and dander: Secondary | ICD-10-CM | POA: Diagnosis not present

## 2017-01-11 DIAGNOSIS — J3089 Other allergic rhinitis: Secondary | ICD-10-CM | POA: Diagnosis not present

## 2017-01-14 DIAGNOSIS — J3081 Allergic rhinitis due to animal (cat) (dog) hair and dander: Secondary | ICD-10-CM | POA: Diagnosis not present

## 2017-01-14 DIAGNOSIS — J301 Allergic rhinitis due to pollen: Secondary | ICD-10-CM | POA: Diagnosis not present

## 2017-01-14 DIAGNOSIS — J3089 Other allergic rhinitis: Secondary | ICD-10-CM | POA: Diagnosis not present

## 2017-01-14 DIAGNOSIS — R05 Cough: Secondary | ICD-10-CM | POA: Diagnosis not present

## 2017-01-16 DIAGNOSIS — H04123 Dry eye syndrome of bilateral lacrimal glands: Secondary | ICD-10-CM | POA: Diagnosis not present

## 2017-01-16 DIAGNOSIS — H02834 Dermatochalasis of left upper eyelid: Secondary | ICD-10-CM | POA: Diagnosis not present

## 2017-01-16 DIAGNOSIS — H02831 Dermatochalasis of right upper eyelid: Secondary | ICD-10-CM | POA: Diagnosis not present

## 2017-01-18 DIAGNOSIS — J3089 Other allergic rhinitis: Secondary | ICD-10-CM | POA: Diagnosis not present

## 2017-01-18 DIAGNOSIS — J3081 Allergic rhinitis due to animal (cat) (dog) hair and dander: Secondary | ICD-10-CM | POA: Diagnosis not present

## 2017-01-18 DIAGNOSIS — J301 Allergic rhinitis due to pollen: Secondary | ICD-10-CM | POA: Diagnosis not present

## 2017-01-25 DIAGNOSIS — J3081 Allergic rhinitis due to animal (cat) (dog) hair and dander: Secondary | ICD-10-CM | POA: Diagnosis not present

## 2017-01-25 DIAGNOSIS — J3089 Other allergic rhinitis: Secondary | ICD-10-CM | POA: Diagnosis not present

## 2017-01-25 DIAGNOSIS — J301 Allergic rhinitis due to pollen: Secondary | ICD-10-CM | POA: Diagnosis not present

## 2017-02-01 DIAGNOSIS — J3089 Other allergic rhinitis: Secondary | ICD-10-CM | POA: Diagnosis not present

## 2017-02-01 DIAGNOSIS — J301 Allergic rhinitis due to pollen: Secondary | ICD-10-CM | POA: Diagnosis not present

## 2017-02-01 DIAGNOSIS — J3081 Allergic rhinitis due to animal (cat) (dog) hair and dander: Secondary | ICD-10-CM | POA: Diagnosis not present

## 2017-02-05 DIAGNOSIS — E213 Hyperparathyroidism, unspecified: Secondary | ICD-10-CM | POA: Diagnosis not present

## 2017-02-08 DIAGNOSIS — J301 Allergic rhinitis due to pollen: Secondary | ICD-10-CM | POA: Diagnosis not present

## 2017-02-08 DIAGNOSIS — J3089 Other allergic rhinitis: Secondary | ICD-10-CM | POA: Diagnosis not present

## 2017-02-08 DIAGNOSIS — J3081 Allergic rhinitis due to animal (cat) (dog) hair and dander: Secondary | ICD-10-CM | POA: Diagnosis not present

## 2017-02-08 DIAGNOSIS — E213 Hyperparathyroidism, unspecified: Secondary | ICD-10-CM | POA: Diagnosis not present

## 2017-02-12 DIAGNOSIS — F411 Generalized anxiety disorder: Secondary | ICD-10-CM | POA: Diagnosis not present

## 2017-02-12 DIAGNOSIS — F900 Attention-deficit hyperactivity disorder, predominantly inattentive type: Secondary | ICD-10-CM | POA: Diagnosis not present

## 2017-02-12 DIAGNOSIS — F3342 Major depressive disorder, recurrent, in full remission: Secondary | ICD-10-CM | POA: Diagnosis not present

## 2017-02-15 DIAGNOSIS — J3089 Other allergic rhinitis: Secondary | ICD-10-CM | POA: Diagnosis not present

## 2017-02-15 DIAGNOSIS — J3081 Allergic rhinitis due to animal (cat) (dog) hair and dander: Secondary | ICD-10-CM | POA: Diagnosis not present

## 2017-02-15 DIAGNOSIS — J301 Allergic rhinitis due to pollen: Secondary | ICD-10-CM | POA: Diagnosis not present

## 2017-02-21 DIAGNOSIS — J3089 Other allergic rhinitis: Secondary | ICD-10-CM | POA: Diagnosis not present

## 2017-02-21 DIAGNOSIS — J301 Allergic rhinitis due to pollen: Secondary | ICD-10-CM | POA: Diagnosis not present

## 2017-02-21 DIAGNOSIS — J3081 Allergic rhinitis due to animal (cat) (dog) hair and dander: Secondary | ICD-10-CM | POA: Diagnosis not present

## 2017-02-22 DIAGNOSIS — E041 Nontoxic single thyroid nodule: Secondary | ICD-10-CM | POA: Diagnosis not present

## 2017-02-22 DIAGNOSIS — E213 Hyperparathyroidism, unspecified: Secondary | ICD-10-CM | POA: Diagnosis not present

## 2017-02-22 DIAGNOSIS — R5382 Chronic fatigue, unspecified: Secondary | ICD-10-CM | POA: Diagnosis not present

## 2017-02-28 DIAGNOSIS — J3081 Allergic rhinitis due to animal (cat) (dog) hair and dander: Secondary | ICD-10-CM | POA: Diagnosis not present

## 2017-02-28 DIAGNOSIS — J301 Allergic rhinitis due to pollen: Secondary | ICD-10-CM | POA: Diagnosis not present

## 2017-02-28 DIAGNOSIS — J3089 Other allergic rhinitis: Secondary | ICD-10-CM | POA: Diagnosis not present

## 2017-03-01 DIAGNOSIS — E210 Primary hyperparathyroidism: Secondary | ICD-10-CM | POA: Diagnosis not present

## 2017-03-08 DIAGNOSIS — J301 Allergic rhinitis due to pollen: Secondary | ICD-10-CM | POA: Diagnosis not present

## 2017-03-08 DIAGNOSIS — J3089 Other allergic rhinitis: Secondary | ICD-10-CM | POA: Diagnosis not present

## 2017-03-08 DIAGNOSIS — J3081 Allergic rhinitis due to animal (cat) (dog) hair and dander: Secondary | ICD-10-CM | POA: Diagnosis not present

## 2017-03-14 DIAGNOSIS — J3081 Allergic rhinitis due to animal (cat) (dog) hair and dander: Secondary | ICD-10-CM | POA: Diagnosis not present

## 2017-03-14 DIAGNOSIS — J3089 Other allergic rhinitis: Secondary | ICD-10-CM | POA: Diagnosis not present

## 2017-03-14 DIAGNOSIS — J301 Allergic rhinitis due to pollen: Secondary | ICD-10-CM | POA: Diagnosis not present

## 2017-03-22 DIAGNOSIS — J301 Allergic rhinitis due to pollen: Secondary | ICD-10-CM | POA: Diagnosis not present

## 2017-03-22 DIAGNOSIS — J3089 Other allergic rhinitis: Secondary | ICD-10-CM | POA: Diagnosis not present

## 2017-03-22 DIAGNOSIS — J3081 Allergic rhinitis due to animal (cat) (dog) hair and dander: Secondary | ICD-10-CM | POA: Diagnosis not present

## 2017-03-28 DIAGNOSIS — J301 Allergic rhinitis due to pollen: Secondary | ICD-10-CM | POA: Diagnosis not present

## 2017-03-28 DIAGNOSIS — J3089 Other allergic rhinitis: Secondary | ICD-10-CM | POA: Diagnosis not present

## 2017-03-28 DIAGNOSIS — J3081 Allergic rhinitis due to animal (cat) (dog) hair and dander: Secondary | ICD-10-CM | POA: Diagnosis not present

## 2017-03-29 DIAGNOSIS — J3089 Other allergic rhinitis: Secondary | ICD-10-CM | POA: Diagnosis not present

## 2017-03-29 DIAGNOSIS — J3081 Allergic rhinitis due to animal (cat) (dog) hair and dander: Secondary | ICD-10-CM | POA: Diagnosis not present

## 2017-04-05 DIAGNOSIS — J3089 Other allergic rhinitis: Secondary | ICD-10-CM | POA: Diagnosis not present

## 2017-04-05 DIAGNOSIS — J301 Allergic rhinitis due to pollen: Secondary | ICD-10-CM | POA: Diagnosis not present

## 2017-04-05 DIAGNOSIS — J3081 Allergic rhinitis due to animal (cat) (dog) hair and dander: Secondary | ICD-10-CM | POA: Diagnosis not present

## 2017-04-12 DIAGNOSIS — J3081 Allergic rhinitis due to animal (cat) (dog) hair and dander: Secondary | ICD-10-CM | POA: Diagnosis not present

## 2017-04-12 DIAGNOSIS — J301 Allergic rhinitis due to pollen: Secondary | ICD-10-CM | POA: Diagnosis not present

## 2017-04-12 DIAGNOSIS — J3089 Other allergic rhinitis: Secondary | ICD-10-CM | POA: Diagnosis not present

## 2017-04-19 DIAGNOSIS — J3089 Other allergic rhinitis: Secondary | ICD-10-CM | POA: Diagnosis not present

## 2017-04-19 DIAGNOSIS — J301 Allergic rhinitis due to pollen: Secondary | ICD-10-CM | POA: Diagnosis not present

## 2017-04-19 DIAGNOSIS — J3081 Allergic rhinitis due to animal (cat) (dog) hair and dander: Secondary | ICD-10-CM | POA: Diagnosis not present

## 2017-04-23 DIAGNOSIS — M542 Cervicalgia: Secondary | ICD-10-CM | POA: Diagnosis not present

## 2017-05-01 DIAGNOSIS — J301 Allergic rhinitis due to pollen: Secondary | ICD-10-CM | POA: Diagnosis not present

## 2017-05-01 DIAGNOSIS — J3089 Other allergic rhinitis: Secondary | ICD-10-CM | POA: Diagnosis not present

## 2017-05-01 DIAGNOSIS — J3081 Allergic rhinitis due to animal (cat) (dog) hair and dander: Secondary | ICD-10-CM | POA: Diagnosis not present

## 2017-05-08 DIAGNOSIS — Z881 Allergy status to other antibiotic agents status: Secondary | ICD-10-CM | POA: Diagnosis not present

## 2017-05-08 DIAGNOSIS — M8580 Other specified disorders of bone density and structure, unspecified site: Secondary | ICD-10-CM | POA: Diagnosis not present

## 2017-05-08 DIAGNOSIS — F17210 Nicotine dependence, cigarettes, uncomplicated: Secondary | ICD-10-CM | POA: Diagnosis not present

## 2017-05-08 DIAGNOSIS — E042 Nontoxic multinodular goiter: Secondary | ICD-10-CM | POA: Diagnosis not present

## 2017-05-08 DIAGNOSIS — F909 Attention-deficit hyperactivity disorder, unspecified type: Secondary | ICD-10-CM | POA: Diagnosis not present

## 2017-05-08 DIAGNOSIS — E210 Primary hyperparathyroidism: Secondary | ICD-10-CM | POA: Diagnosis not present

## 2017-05-08 DIAGNOSIS — J309 Allergic rhinitis, unspecified: Secondary | ICD-10-CM | POA: Diagnosis not present

## 2017-05-08 DIAGNOSIS — D351 Benign neoplasm of parathyroid gland: Secondary | ICD-10-CM | POA: Diagnosis not present

## 2017-05-08 DIAGNOSIS — E213 Hyperparathyroidism, unspecified: Secondary | ICD-10-CM | POA: Diagnosis not present

## 2017-05-08 DIAGNOSIS — Z882 Allergy status to sulfonamides status: Secondary | ICD-10-CM | POA: Diagnosis not present

## 2017-05-08 DIAGNOSIS — Z780 Asymptomatic menopausal state: Secondary | ICD-10-CM | POA: Diagnosis not present

## 2017-05-08 DIAGNOSIS — Z79899 Other long term (current) drug therapy: Secondary | ICD-10-CM | POA: Diagnosis not present

## 2017-05-08 DIAGNOSIS — F1099 Alcohol use, unspecified with unspecified alcohol-induced disorder: Secondary | ICD-10-CM | POA: Diagnosis not present

## 2017-05-08 DIAGNOSIS — F419 Anxiety disorder, unspecified: Secondary | ICD-10-CM | POA: Diagnosis not present

## 2017-05-08 DIAGNOSIS — K760 Fatty (change of) liver, not elsewhere classified: Secondary | ICD-10-CM | POA: Diagnosis not present

## 2017-05-08 DIAGNOSIS — F329 Major depressive disorder, single episode, unspecified: Secondary | ICD-10-CM | POA: Diagnosis not present

## 2017-05-09 DIAGNOSIS — Z881 Allergy status to other antibiotic agents status: Secondary | ICD-10-CM | POA: Diagnosis not present

## 2017-05-09 DIAGNOSIS — F329 Major depressive disorder, single episode, unspecified: Secondary | ICD-10-CM | POA: Diagnosis not present

## 2017-05-09 DIAGNOSIS — F909 Attention-deficit hyperactivity disorder, unspecified type: Secondary | ICD-10-CM | POA: Diagnosis not present

## 2017-05-09 DIAGNOSIS — F17210 Nicotine dependence, cigarettes, uncomplicated: Secondary | ICD-10-CM | POA: Diagnosis not present

## 2017-05-09 DIAGNOSIS — E213 Hyperparathyroidism, unspecified: Secondary | ICD-10-CM | POA: Diagnosis not present

## 2017-05-09 DIAGNOSIS — M8580 Other specified disorders of bone density and structure, unspecified site: Secondary | ICD-10-CM | POA: Diagnosis not present

## 2017-05-09 DIAGNOSIS — E210 Primary hyperparathyroidism: Secondary | ICD-10-CM | POA: Diagnosis not present

## 2017-05-09 DIAGNOSIS — F419 Anxiety disorder, unspecified: Secondary | ICD-10-CM | POA: Diagnosis not present

## 2017-05-09 DIAGNOSIS — Z79899 Other long term (current) drug therapy: Secondary | ICD-10-CM | POA: Diagnosis not present

## 2017-05-09 DIAGNOSIS — J309 Allergic rhinitis, unspecified: Secondary | ICD-10-CM | POA: Diagnosis not present

## 2017-05-09 DIAGNOSIS — E042 Nontoxic multinodular goiter: Secondary | ICD-10-CM | POA: Diagnosis not present

## 2017-05-09 DIAGNOSIS — K760 Fatty (change of) liver, not elsewhere classified: Secondary | ICD-10-CM | POA: Diagnosis not present

## 2017-05-09 DIAGNOSIS — Z882 Allergy status to sulfonamides status: Secondary | ICD-10-CM | POA: Diagnosis not present

## 2017-05-09 DIAGNOSIS — Z780 Asymptomatic menopausal state: Secondary | ICD-10-CM | POA: Diagnosis not present

## 2017-05-16 DIAGNOSIS — Z9889 Other specified postprocedural states: Secondary | ICD-10-CM | POA: Diagnosis not present

## 2017-05-16 DIAGNOSIS — Z4889 Encounter for other specified surgical aftercare: Secondary | ICD-10-CM | POA: Diagnosis not present

## 2017-05-16 DIAGNOSIS — J3089 Other allergic rhinitis: Secondary | ICD-10-CM | POA: Diagnosis not present

## 2017-05-16 DIAGNOSIS — E892 Postprocedural hypoparathyroidism: Secondary | ICD-10-CM | POA: Diagnosis not present

## 2017-05-16 DIAGNOSIS — J301 Allergic rhinitis due to pollen: Secondary | ICD-10-CM | POA: Diagnosis not present

## 2017-05-16 DIAGNOSIS — J3081 Allergic rhinitis due to animal (cat) (dog) hair and dander: Secondary | ICD-10-CM | POA: Diagnosis not present

## 2017-05-22 DIAGNOSIS — J3089 Other allergic rhinitis: Secondary | ICD-10-CM | POA: Diagnosis not present

## 2017-05-22 DIAGNOSIS — J3081 Allergic rhinitis due to animal (cat) (dog) hair and dander: Secondary | ICD-10-CM | POA: Diagnosis not present

## 2017-05-22 DIAGNOSIS — J301 Allergic rhinitis due to pollen: Secondary | ICD-10-CM | POA: Diagnosis not present

## 2017-05-31 DIAGNOSIS — J3089 Other allergic rhinitis: Secondary | ICD-10-CM | POA: Diagnosis not present

## 2017-05-31 DIAGNOSIS — J3081 Allergic rhinitis due to animal (cat) (dog) hair and dander: Secondary | ICD-10-CM | POA: Diagnosis not present

## 2017-05-31 DIAGNOSIS — J301 Allergic rhinitis due to pollen: Secondary | ICD-10-CM | POA: Diagnosis not present

## 2017-06-04 DIAGNOSIS — J3089 Other allergic rhinitis: Secondary | ICD-10-CM | POA: Diagnosis not present

## 2017-06-04 DIAGNOSIS — J3081 Allergic rhinitis due to animal (cat) (dog) hair and dander: Secondary | ICD-10-CM | POA: Diagnosis not present

## 2017-06-04 DIAGNOSIS — J301 Allergic rhinitis due to pollen: Secondary | ICD-10-CM | POA: Diagnosis not present

## 2017-06-14 DIAGNOSIS — J301 Allergic rhinitis due to pollen: Secondary | ICD-10-CM | POA: Diagnosis not present

## 2017-06-14 DIAGNOSIS — J3089 Other allergic rhinitis: Secondary | ICD-10-CM | POA: Diagnosis not present

## 2017-06-14 DIAGNOSIS — J3081 Allergic rhinitis due to animal (cat) (dog) hair and dander: Secondary | ICD-10-CM | POA: Diagnosis not present

## 2017-06-17 DIAGNOSIS — Z9889 Other specified postprocedural states: Secondary | ICD-10-CM | POA: Diagnosis not present

## 2017-06-17 DIAGNOSIS — E892 Postprocedural hypoparathyroidism: Secondary | ICD-10-CM | POA: Diagnosis not present

## 2017-06-21 DIAGNOSIS — J3081 Allergic rhinitis due to animal (cat) (dog) hair and dander: Secondary | ICD-10-CM | POA: Diagnosis not present

## 2017-06-21 DIAGNOSIS — J3089 Other allergic rhinitis: Secondary | ICD-10-CM | POA: Diagnosis not present

## 2017-06-21 DIAGNOSIS — J301 Allergic rhinitis due to pollen: Secondary | ICD-10-CM | POA: Diagnosis not present

## 2017-07-12 DIAGNOSIS — J3081 Allergic rhinitis due to animal (cat) (dog) hair and dander: Secondary | ICD-10-CM | POA: Diagnosis not present

## 2017-07-12 DIAGNOSIS — J301 Allergic rhinitis due to pollen: Secondary | ICD-10-CM | POA: Diagnosis not present

## 2017-07-12 DIAGNOSIS — J3089 Other allergic rhinitis: Secondary | ICD-10-CM | POA: Diagnosis not present

## 2017-07-18 DIAGNOSIS — M545 Low back pain: Secondary | ICD-10-CM | POA: Diagnosis not present

## 2017-07-25 DIAGNOSIS — J301 Allergic rhinitis due to pollen: Secondary | ICD-10-CM | POA: Diagnosis not present

## 2017-07-25 DIAGNOSIS — J3081 Allergic rhinitis due to animal (cat) (dog) hair and dander: Secondary | ICD-10-CM | POA: Diagnosis not present

## 2017-07-25 DIAGNOSIS — J3089 Other allergic rhinitis: Secondary | ICD-10-CM | POA: Diagnosis not present

## 2017-07-26 ENCOUNTER — Ambulatory Visit: Payer: BLUE CROSS/BLUE SHIELD | Admitting: Diagnostic Neuroimaging

## 2017-07-26 ENCOUNTER — Other Ambulatory Visit: Payer: Self-pay | Admitting: Specialist

## 2017-07-26 DIAGNOSIS — Z1231 Encounter for screening mammogram for malignant neoplasm of breast: Secondary | ICD-10-CM

## 2017-07-29 DIAGNOSIS — J301 Allergic rhinitis due to pollen: Secondary | ICD-10-CM | POA: Diagnosis not present

## 2017-07-29 DIAGNOSIS — J3089 Other allergic rhinitis: Secondary | ICD-10-CM | POA: Diagnosis not present

## 2017-07-29 DIAGNOSIS — J3081 Allergic rhinitis due to animal (cat) (dog) hair and dander: Secondary | ICD-10-CM | POA: Diagnosis not present

## 2017-08-06 DIAGNOSIS — F3342 Major depressive disorder, recurrent, in full remission: Secondary | ICD-10-CM | POA: Diagnosis not present

## 2017-08-06 DIAGNOSIS — F900 Attention-deficit hyperactivity disorder, predominantly inattentive type: Secondary | ICD-10-CM | POA: Diagnosis not present

## 2017-08-06 DIAGNOSIS — F411 Generalized anxiety disorder: Secondary | ICD-10-CM | POA: Diagnosis not present

## 2017-08-09 DIAGNOSIS — J3089 Other allergic rhinitis: Secondary | ICD-10-CM | POA: Diagnosis not present

## 2017-08-09 DIAGNOSIS — J3081 Allergic rhinitis due to animal (cat) (dog) hair and dander: Secondary | ICD-10-CM | POA: Diagnosis not present

## 2017-08-09 DIAGNOSIS — J301 Allergic rhinitis due to pollen: Secondary | ICD-10-CM | POA: Diagnosis not present

## 2017-08-13 ENCOUNTER — Telehealth: Payer: Self-pay | Admitting: *Deleted

## 2017-08-13 ENCOUNTER — Encounter: Payer: Self-pay | Admitting: Diagnostic Neuroimaging

## 2017-08-13 ENCOUNTER — Ambulatory Visit: Payer: BLUE CROSS/BLUE SHIELD | Admitting: Diagnostic Neuroimaging

## 2017-08-13 VITALS — BP 119/72 | HR 92 | Wt 158.0 lb

## 2017-08-13 DIAGNOSIS — G4700 Insomnia, unspecified: Secondary | ICD-10-CM | POA: Diagnosis not present

## 2017-08-13 DIAGNOSIS — F902 Attention-deficit hyperactivity disorder, combined type: Secondary | ICD-10-CM | POA: Diagnosis not present

## 2017-08-13 DIAGNOSIS — R413 Other amnesia: Secondary | ICD-10-CM | POA: Diagnosis not present

## 2017-08-13 DIAGNOSIS — F411 Generalized anxiety disorder: Secondary | ICD-10-CM

## 2017-08-13 NOTE — Telephone Encounter (Signed)
Patient was no show for follow up today. 

## 2017-08-13 NOTE — Progress Notes (Signed)
[facility]  PATIENT: [name] [name] DOB: 1956-04-14  REFERRING CLINICIAN: Margarita Grizzle, NP HISTORY FROM: patient  REASON FOR VISIT: follow up    HISTORICAL  CHIEF COMPLAINT:  Chief Complaint  Patient presents with  . Memory Loss    rm 7  MMSE 30  . Follow-up    HISTORY OF PRESENT ILLNESS:   UPDATE (08/13/17, VRP): Since last visit, doing well. Tolerating meds. No alleviating or aggravating factors. Memory loss sxs have slightly improved. Still with sleep and anxiety issues. Now s/p parathyroidectomy.   UPDATE 10/24/16: Since last visit, symptoms are stable to slightly improved. MRI reviewed. LDL still very high. Also found to have slightly high calcium. Did not see PT yet.  PRIOR HPI (08/07/16): 62 year old female with ADHD, depression, fatty liver disease, here for evaluation of memory difficulties. Patient reports lifelong history of ADHD problems, more recent problems with depression, and now increasing problems with word finding difficulty, short-term memory loss, difficulty with concentration. Patient was diagnosed officially with ADHD and depression in the year Oct 15, 1998 at the [facility]. Around that time she was started on medication. Since October 14, 2008 her symptoms have increasingly worsened. Patient has increasing stress related to her sister passing away 10/14/13, mother passed away October 14, 2013, her responsibility of handling family states, and other family conflicts. Patient also having increasing dizziness and weight loss since October 15, 2015. Patient reports history of multiple sclerosis evaluation almost 20 years ago due to presence of "spots in the brain" but had a spinal tap and was found to have no oligoclonal bands. Therefore multiple sclerosis was ruled out at that time.   REVIEW OF SYSTEMS: Full 14 system review of systems performed and negative with exception of: joint pain confusion decr concentration.  ALLERGIES: Allergies  Allergen Reactions  . Other Cough   Grass/pollen  . Sulfa Antibiotics Other (See Comments)    Flu-like symptoms  . Augmentin [Amoxicillin-Pot Clavulanate] Rash    HOME MEDICATIONS: Outpatient Medications Prior to Visit  Medication Sig Dispense Refill  . amphetamine-dextroamphetamine (ADDERALL) 20 MG tablet Take 20 mg by mouth 3 (three) times daily as needed. Pt takes up to three times a day for ADD    . buPROPion (WELLBUTRIN SR) 150 MG 12 hr tablet Take 150 mg by mouth daily.     . Calcium Carbonate-Vitamin D (CALCIUM HIGH POTENCY/VITAMIN D) 600-200 MG-UNIT TABS Take by mouth daily.    . fexofenadine (ALLEGRA) 180 MG tablet Take 180 mg by mouth daily.    Marland Kitchen levothyroxine (SYNTHROID, LEVOTHROID) 100 MCG tablet Take 100 mcg by mouth daily before breakfast.    . LORazepam (ATIVAN) 1 MG tablet Takes .25 mg nightly    . montelukast (SINGULAIR) 10 MG tablet 10 mg daily.    . Multiple Vitamin (MULTIVITAMIN WITH MINERALS) TABS Take 1 tablet by mouth daily.    . traZODone (DESYREL) 50 MG tablet 50 mg at bedtime.    Marland Kitchen UNABLE TO FIND Med Name: Allergy shots by Dr. Jeni Salles     No facility-administered medications prior to visit.     PAST MEDICAL HISTORY: Past Medical History:  Diagnosis Date  . ADD (attention deficit disorder)   . Anemia   . Anxiety   . Depression   . Memory loss   . Osteopenia   . Seasonal allergies   . Shortness of breath    due to anemia  . URI (upper respiratory infection)    current cough and head congestion- no fever  . Vitamin D deficiency  PAST SURGICAL HISTORY: Past Surgical History:  Procedure Laterality Date  . back cysts removed     multiple  removed x 4  . BACK SURGERY  2009   Hemi laminectomy  . DIAGNOSTIC LAPAROSCOPY    . DILATION AND CURETTAGE OF UTERUS  2013   hysteroscopy resect polyp  . PARATHYROIDECTOMY     thyroidectomy (partial)  . WISDOM TOOTH EXTRACTION      FAMILY HISTORY: Family History  Problem Relation Age of Onset  . Cancer Father   . Cancer Sister   .  Cancer Brother   . Cancer Paternal Uncle     SOCIAL HISTORY:  Social History   Socioeconomic History  . Marital status: Married    Spouse name: Richard  . Number of children: 0  . Years of education: 25  . Highest education level: Not on file  Social Needs  . Financial resource strain: Not on file  . Food insecurity - worry: Not on file  . Food insecurity - inability: Not on file  . Transportation needs - medical: Not on file  . Transportation needs - non-medical: Not on file  Occupational History    Comment: unemployed  Tobacco Use  . Smoking status: Current Some Day Smoker    Packs/day: 0.50    Years: 15.00    Pack years: 7.50    Types: Cigarettes  . Smokeless tobacco: Never Used  . Tobacco comment: 08/07/16 maybe 1 cig/day  Substance and Sexual Activity  . Alcohol use: Yes    Alcohol/week: 1.2 oz    Types: 2 Cans of beer per week    Comment: daily  . Drug use: No    Comment: quit 2016  . Sexual activity: No    Birth control/protection: Abstinence  Other Topics Concern  . Not on file  Social History Narrative   Lives with spouse   Caffeine- coffee, 2 big cups daily     PHYSICAL EXAM  GENERAL EXAM/CONSTITUTIONAL: Vitals:  Vitals:   08/13/17 1357  BP: 119/72  Pulse: 92  Weight: 158 lb (71.7 kg)   Body mass index is 27.12 kg/m. No exam data present  Patient is in no distress; well developed, nourished and groomed; neck is supple  CARDIOVASCULAR:  Examination of carotid arteries is normal; no carotid bruits  Regular rate and rhythm, no murmurs  Examination of peripheral vascular system by observation and palpation is normal  EYES:  Ophthalmoscopic exam of optic discs and posterior segments is normal; no papilledema or hemorrhages  MUSCULOSKELETAL:  Gait, strength, tone, movements noted in Neurologic exam below  NEUROLOGIC: MENTAL STATUS:  MMSE - [place] Exam 08/13/2017 10/24/2016 08/07/2016  Orientation to time 5 4 5   Orientation to  Place 5 5 5   Registration 3 3 3   Attention/ Calculation 5 5 5   Recall 3 3 3   Language- name 2 objects 2 2 2   Language- repeat 1 1 1   Language- follow 3 step command 3 2 3   Language- read & follow direction 1 1 1   Write a sentence 1 1 1   Copy design 1 1 1   Total score 30 28 30     awake, alert, oriented to person, place and time  recent and remote memory intact  normal attention and concentration  language fluent, comprehension intact, naming intact,   fund of knowledge appropriate  CRANIAL NERVE:   2nd - no papilledema on fundoscopic exam  2nd, 3rd, 4th, 6th - pupils equal and reactive to light, visual fields full  to confrontation, extraocular muscles intact, no nystagmus  5th - facial sensation symmetric  7th - facial strength symmetric  8th - hearing intact  9th - palate elevates symmetrically, uvula midline  11th - shoulder shrug symmetric  12th - tongue protrusion midline  MOTOR:   normal bulk and tone, full strength in the BUE, BLE  SENSORY:   normal and symmetric to light touch  COORDINATION:   finger-nose-finger, fine finger movements normal  REFLEXES:   deep tendon reflexes present and symmetric  GAIT/STATION:   narrow based gait; ABLE TO WALK TANDEM; romberg is negative    DIAGNOSTIC DATA (LABS, IMAGING, TESTING) - I reviewed patient records, labs, notes, testing and imaging myself where available.  Lab Results  Component Value Date   WBC 8.6 01/11/2015   HGB 16.1 (H) 01/11/2015   HCT 46.8 (H) 01/11/2015   MCV 91.4 01/11/2015   PLT 378 01/11/2015      Component Value Date/Time   NA 139 01/11/2015 1732   K 4.1 01/11/2015 1732   CL 105 01/11/2015 1732   CO2 26 01/11/2015 1732   GLUCOSE 107 (H) 01/11/2015 1732   BUN 20 01/11/2015 1732   CREATININE 0.89 01/11/2015 1732   CALCIUM 10.0 01/11/2015 1732   PROT 8.2 (H) 01/11/2015 1732   ALBUMIN 4.4 01/11/2015 1732   AST 69 (H) 01/11/2015 1732   ALT 104 (H) 01/11/2015 1732    ALKPHOS 110 01/11/2015 1732   BILITOT 0.7 01/11/2015 1732   GFRNONAA >60 01/11/2015 1732   GFRAA >60 01/11/2015 1732   Lab Results  Component Value Date   CHOL 242 (HH) 07/24/2006   HDL 46.6 07/24/2006   LDLDIRECT 180.1 07/24/2006   TRIG 137 07/24/2006   CHOLHDL 5.2 CALC 07/24/2006   No results found for: HGBA1C No results found for: VITAMINB12 Lab Results  Component Value Date   TSH 1.95 07/24/2006    08/15/16 MRI brain [I reviewed images myself and agree with interpretation. Likely chronic small vessel ischemic disease. -VRP]  - abnormal MRI scan of the brain showing scattered periventricular and subcortical nonspecific white matter hyperintensities on T2/flair images which are nonspecific with the differential discussed above. No enhancing lesions are noted.     ASSESSMENT AND PLAN  62 y.o. year old female here with worsening memory problems, attention difficulty, depression, family stress.   Ddx: ADHD, depression, anxiety, insomnia  1. Memory loss   2. Attention deficit hyperactivity disorder (ADHD), combined type   3. Insomnia, unspecified type   4. Generalized anxiety disorder     PLAN:  I spent 15 minutes of face to face time with patient. Greater than 50% of time was spent in counseling and coordination of care with patient. In summary we discussed:   - continue ADHD and depression treatments per psychiatry (Dr. Toy Care) - brain health activities reviewed  Return if symptoms worsen or fail to improve, for return to PCP.    Penni Bombard, MD 2/75/1700, 1:74 PM Certified in Neurology, Neurophysiology and Neuroimaging  [facility] [address], [place], [place] [address] [phone]

## 2017-08-15 ENCOUNTER — Ambulatory Visit
Admission: RE | Admit: 2017-08-15 | Discharge: 2017-08-15 | Disposition: A | Discharge status: Home / Self Care | Payer: BLUE CROSS/BLUE SHIELD | Source: Ambulatory Visit | Attending: Specialist | Admitting: Specialist

## 2017-08-15 DIAGNOSIS — Z1231 Encounter for screening mammogram for malignant neoplasm of breast: Secondary | ICD-10-CM

## 2017-08-16 DIAGNOSIS — J301 Allergic rhinitis due to pollen: Secondary | ICD-10-CM | POA: Diagnosis not present

## 2017-08-16 DIAGNOSIS — J3081 Allergic rhinitis due to animal (cat) (dog) hair and dander: Secondary | ICD-10-CM | POA: Diagnosis not present

## 2017-08-16 DIAGNOSIS — J3089 Other allergic rhinitis: Secondary | ICD-10-CM | POA: Diagnosis not present

## 2017-08-23 DIAGNOSIS — J3081 Allergic rhinitis due to animal (cat) (dog) hair and dander: Secondary | ICD-10-CM | POA: Diagnosis not present

## 2017-08-23 DIAGNOSIS — J301 Allergic rhinitis due to pollen: Secondary | ICD-10-CM | POA: Diagnosis not present

## 2017-08-23 DIAGNOSIS — J3089 Other allergic rhinitis: Secondary | ICD-10-CM | POA: Diagnosis not present

## 2017-08-30 DIAGNOSIS — J301 Allergic rhinitis due to pollen: Secondary | ICD-10-CM | POA: Diagnosis not present

## 2017-08-30 DIAGNOSIS — J3089 Other allergic rhinitis: Secondary | ICD-10-CM | POA: Diagnosis not present

## 2017-08-30 DIAGNOSIS — J3081 Allergic rhinitis due to animal (cat) (dog) hair and dander: Secondary | ICD-10-CM | POA: Diagnosis not present

## 2017-09-06 DIAGNOSIS — J301 Allergic rhinitis due to pollen: Secondary | ICD-10-CM | POA: Diagnosis not present

## 2017-09-06 DIAGNOSIS — J3089 Other allergic rhinitis: Secondary | ICD-10-CM | POA: Diagnosis not present

## 2017-09-22 IMAGING — MG 2D DIGITAL DIAGNOSTIC UNILATERAL LEFT MAMMOGRAM WITH CAD AND ADJ
6 series · 6 of 14 positions shown · non-contrast
Comparison: Previous exam(s).

CLINICAL DATA: Left breast upper outer quadrant focal asymmetry
seen on most recent screening mammography.

EXAM:
2D DIGITAL DIAGNOSTIC UNILATERAL LEFT MAMMOGRAM WITH CAD AND ADJUNCT
TOMO

[L MLO synth-2D]
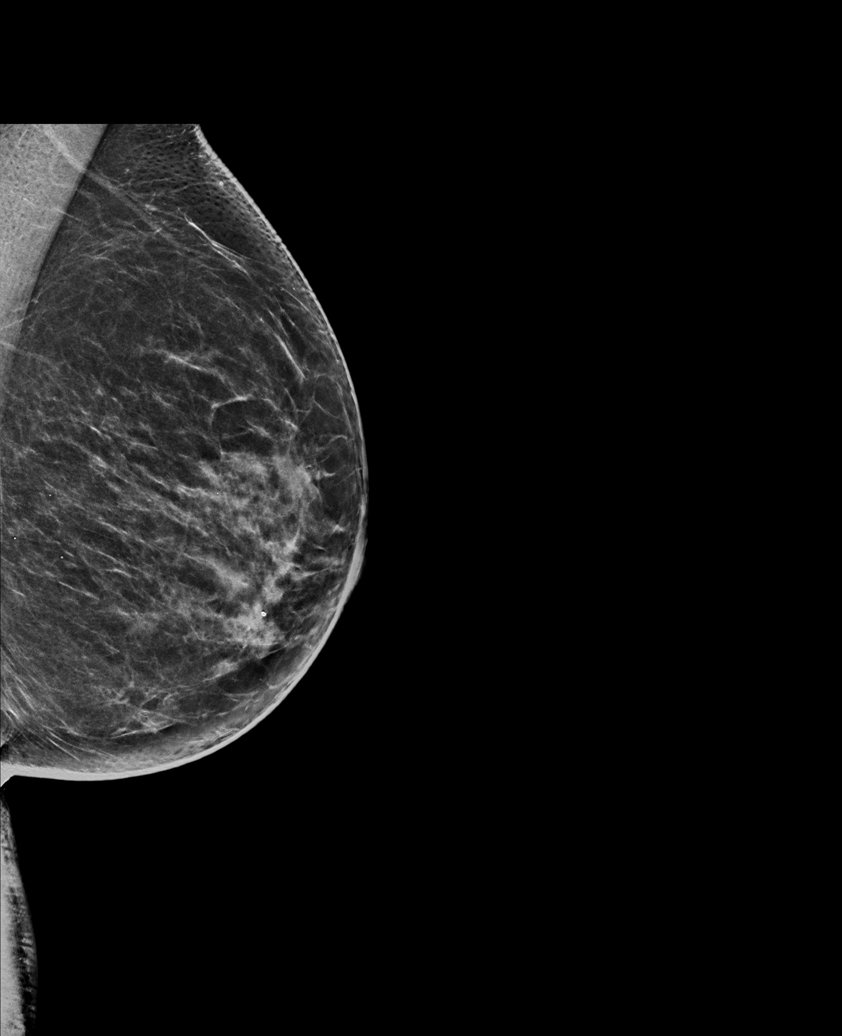

[L CC]
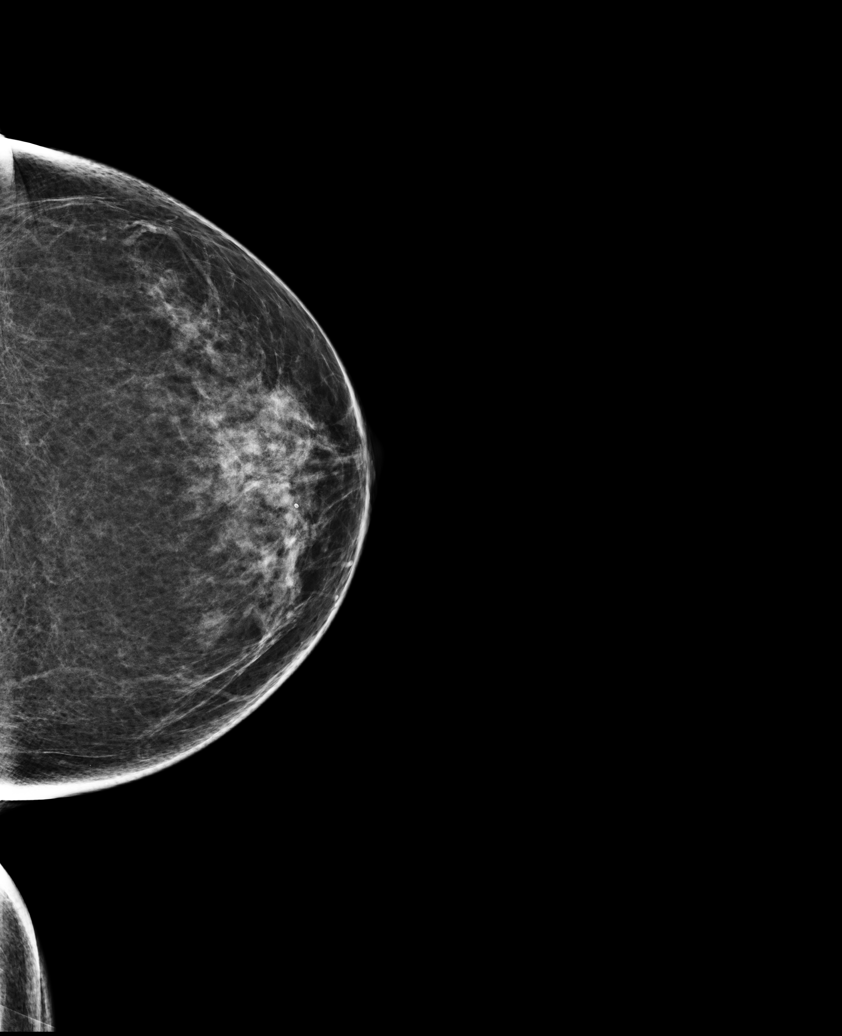

[L MLO]
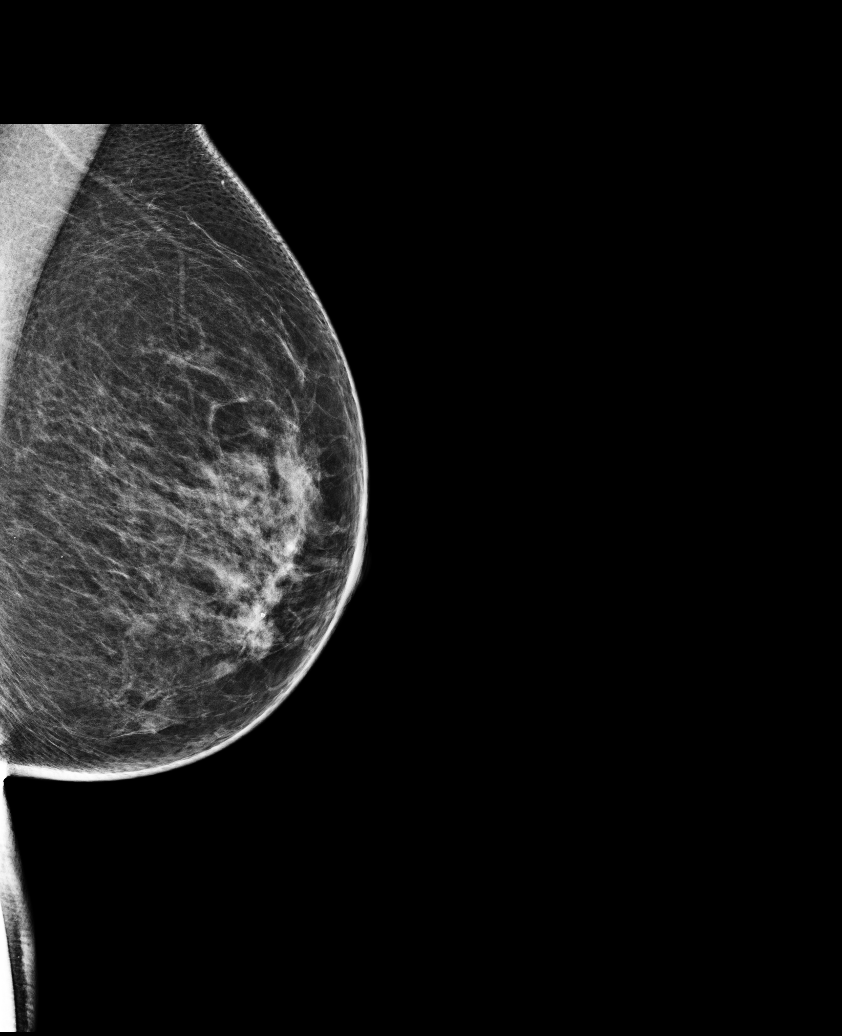

[L CC synth-2D]
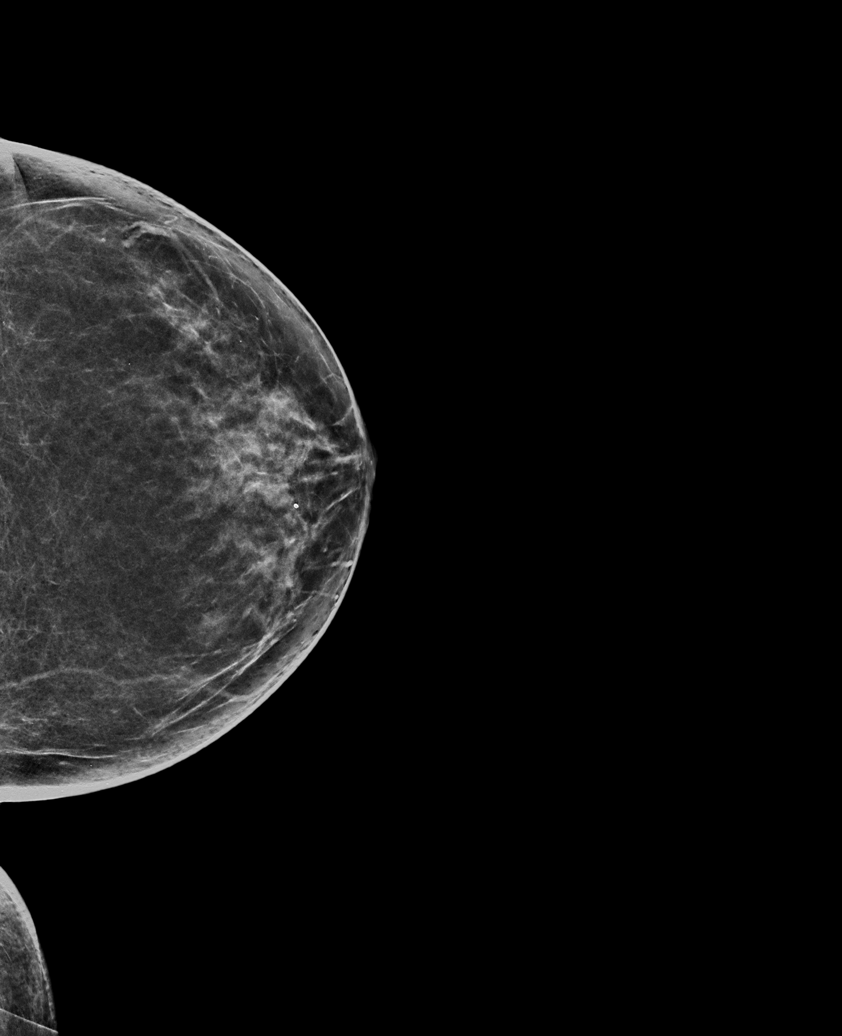

[L CC tomo · tomo slice 35/69.0]
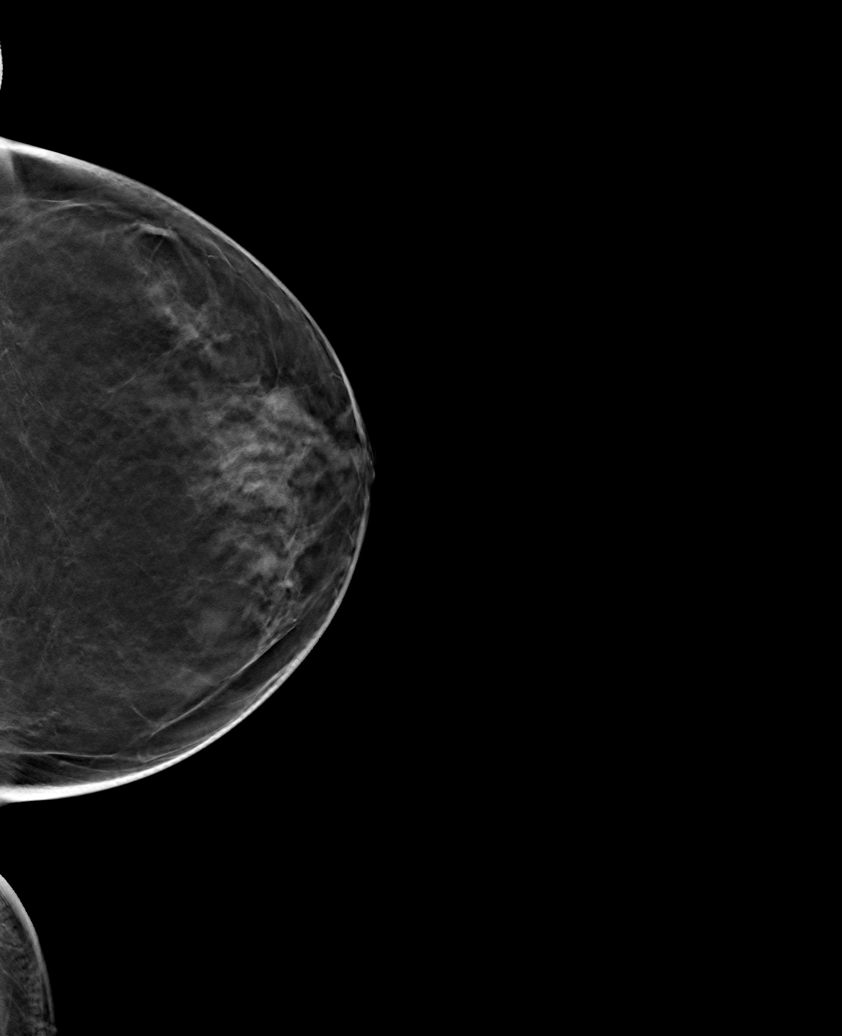

[L MLO tomo · tomo slice 35/68.0]
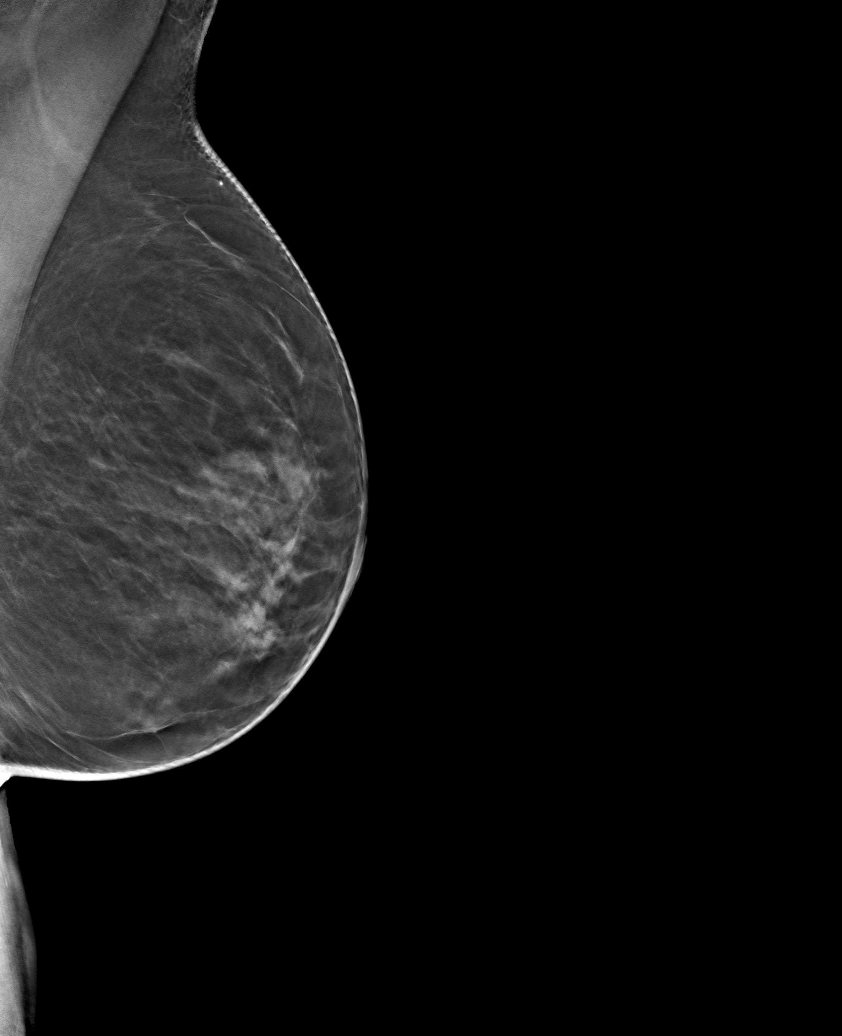

[6 of 14 positions shown; findings below may reference images not displayed]

ACR Breast Density Category b: There are scattered areas of
fibroglandular density.
FINDINGS: Additional mammographic views of the left breast demonstrate no
suspicious masses, areas of architectural distortion or
microcalcifications. The previously seen focal asymmetry in the left
breast upper outer quadrant, anterior depth, effaces to glandular
tissue.

Mammographic images were processed with CAD.
IMPRESSION: No mammographic evidence of malignancy in the left breast.

RECOMMENDATION:
Screening mammogram in one year.(Code:KZ-B-FPN)

I have discussed the findings and recommendations with the patient.
Results were also provided in writing at the conclusion of the
visit. If applicable, a reminder letter will be sent to the patient
regarding the next appointment.

BI-RADS CATEGORY  1: Negative.

## 2017-09-26 DIAGNOSIS — J301 Allergic rhinitis due to pollen: Secondary | ICD-10-CM | POA: Diagnosis not present

## 2017-09-26 DIAGNOSIS — J3089 Other allergic rhinitis: Secondary | ICD-10-CM | POA: Diagnosis not present

## 2017-09-26 DIAGNOSIS — J3081 Allergic rhinitis due to animal (cat) (dog) hair and dander: Secondary | ICD-10-CM | POA: Diagnosis not present

## 2017-10-04 DIAGNOSIS — J301 Allergic rhinitis due to pollen: Secondary | ICD-10-CM | POA: Diagnosis not present

## 2017-10-04 DIAGNOSIS — J3081 Allergic rhinitis due to animal (cat) (dog) hair and dander: Secondary | ICD-10-CM | POA: Diagnosis not present

## 2017-10-04 DIAGNOSIS — J3089 Other allergic rhinitis: Secondary | ICD-10-CM | POA: Diagnosis not present

## 2017-10-14 DIAGNOSIS — J3081 Allergic rhinitis due to animal (cat) (dog) hair and dander: Secondary | ICD-10-CM | POA: Diagnosis not present

## 2017-10-14 DIAGNOSIS — J3089 Other allergic rhinitis: Secondary | ICD-10-CM | POA: Diagnosis not present

## 2017-10-14 DIAGNOSIS — J301 Allergic rhinitis due to pollen: Secondary | ICD-10-CM | POA: Diagnosis not present

## 2017-10-14 DIAGNOSIS — M25551 Pain in right hip: Secondary | ICD-10-CM | POA: Diagnosis not present

## 2017-10-28 DIAGNOSIS — J301 Allergic rhinitis due to pollen: Secondary | ICD-10-CM | POA: Diagnosis not present

## 2017-10-28 DIAGNOSIS — J3081 Allergic rhinitis due to animal (cat) (dog) hair and dander: Secondary | ICD-10-CM | POA: Diagnosis not present

## 2017-10-28 DIAGNOSIS — J3089 Other allergic rhinitis: Secondary | ICD-10-CM | POA: Diagnosis not present

## 2017-11-07 DIAGNOSIS — M1611 Unilateral primary osteoarthritis, right hip: Secondary | ICD-10-CM | POA: Diagnosis not present

## 2017-11-12 DIAGNOSIS — M1611 Unilateral primary osteoarthritis, right hip: Secondary | ICD-10-CM | POA: Diagnosis not present

## 2017-11-14 DIAGNOSIS — M1611 Unilateral primary osteoarthritis, right hip: Secondary | ICD-10-CM | POA: Diagnosis not present

## 2017-11-15 DIAGNOSIS — J301 Allergic rhinitis due to pollen: Secondary | ICD-10-CM | POA: Diagnosis not present

## 2017-11-15 DIAGNOSIS — J3081 Allergic rhinitis due to animal (cat) (dog) hair and dander: Secondary | ICD-10-CM | POA: Diagnosis not present

## 2017-11-19 DIAGNOSIS — M1611 Unilateral primary osteoarthritis, right hip: Secondary | ICD-10-CM | POA: Diagnosis not present

## 2017-11-21 DIAGNOSIS — E890 Postprocedural hypothyroidism: Secondary | ICD-10-CM | POA: Diagnosis not present

## 2017-11-21 DIAGNOSIS — E892 Postprocedural hypoparathyroidism: Secondary | ICD-10-CM | POA: Diagnosis not present

## 2017-11-21 DIAGNOSIS — Z9889 Other specified postprocedural states: Secondary | ICD-10-CM | POA: Diagnosis not present

## 2017-11-21 DIAGNOSIS — M1611 Unilateral primary osteoarthritis, right hip: Secondary | ICD-10-CM | POA: Diagnosis not present

## 2017-11-22 DIAGNOSIS — J3089 Other allergic rhinitis: Secondary | ICD-10-CM | POA: Diagnosis not present

## 2017-11-22 DIAGNOSIS — J3081 Allergic rhinitis due to animal (cat) (dog) hair and dander: Secondary | ICD-10-CM | POA: Diagnosis not present

## 2017-11-22 DIAGNOSIS — J301 Allergic rhinitis due to pollen: Secondary | ICD-10-CM | POA: Diagnosis not present

## 2017-11-26 DIAGNOSIS — M1611 Unilateral primary osteoarthritis, right hip: Secondary | ICD-10-CM | POA: Diagnosis not present

## 2017-11-28 DIAGNOSIS — M1611 Unilateral primary osteoarthritis, right hip: Secondary | ICD-10-CM | POA: Diagnosis not present

## 2017-11-29 DIAGNOSIS — J3089 Other allergic rhinitis: Secondary | ICD-10-CM | POA: Diagnosis not present

## 2017-11-29 DIAGNOSIS — J301 Allergic rhinitis due to pollen: Secondary | ICD-10-CM | POA: Diagnosis not present

## 2017-11-29 DIAGNOSIS — J3081 Allergic rhinitis due to animal (cat) (dog) hair and dander: Secondary | ICD-10-CM | POA: Diagnosis not present

## 2017-12-03 DIAGNOSIS — M1611 Unilateral primary osteoarthritis, right hip: Secondary | ICD-10-CM | POA: Diagnosis not present

## 2017-12-05 DIAGNOSIS — M1611 Unilateral primary osteoarthritis, right hip: Secondary | ICD-10-CM | POA: Diagnosis not present

## 2017-12-12 DIAGNOSIS — Z6825 Body mass index (BMI) 25.0-25.9, adult: Secondary | ICD-10-CM | POA: Diagnosis not present

## 2017-12-12 DIAGNOSIS — Z1212 Encounter for screening for malignant neoplasm of rectum: Secondary | ICD-10-CM | POA: Diagnosis not present

## 2017-12-12 DIAGNOSIS — Z01419 Encounter for gynecological examination (general) (routine) without abnormal findings: Secondary | ICD-10-CM | POA: Diagnosis not present

## 2017-12-12 DIAGNOSIS — Z8042 Family history of malignant neoplasm of prostate: Secondary | ICD-10-CM | POA: Diagnosis not present

## 2017-12-16 DIAGNOSIS — J301 Allergic rhinitis due to pollen: Secondary | ICD-10-CM | POA: Diagnosis not present

## 2017-12-16 DIAGNOSIS — J3081 Allergic rhinitis due to animal (cat) (dog) hair and dander: Secondary | ICD-10-CM | POA: Diagnosis not present

## 2017-12-16 DIAGNOSIS — J3089 Other allergic rhinitis: Secondary | ICD-10-CM | POA: Diagnosis not present

## 2017-12-27 DIAGNOSIS — J3089 Other allergic rhinitis: Secondary | ICD-10-CM | POA: Diagnosis not present

## 2017-12-27 DIAGNOSIS — J301 Allergic rhinitis due to pollen: Secondary | ICD-10-CM | POA: Diagnosis not present

## 2017-12-27 DIAGNOSIS — J3081 Allergic rhinitis due to animal (cat) (dog) hair and dander: Secondary | ICD-10-CM | POA: Diagnosis not present

## 2018-01-09 DIAGNOSIS — J301 Allergic rhinitis due to pollen: Secondary | ICD-10-CM | POA: Diagnosis not present

## 2018-01-09 DIAGNOSIS — J3081 Allergic rhinitis due to animal (cat) (dog) hair and dander: Secondary | ICD-10-CM | POA: Diagnosis not present

## 2018-01-09 DIAGNOSIS — J3089 Other allergic rhinitis: Secondary | ICD-10-CM | POA: Diagnosis not present

## 2018-01-16 DIAGNOSIS — H31091 Other chorioretinal scars, right eye: Secondary | ICD-10-CM | POA: Diagnosis not present

## 2018-01-16 DIAGNOSIS — H353131 Nonexudative age-related macular degeneration, bilateral, early dry stage: Secondary | ICD-10-CM | POA: Diagnosis not present

## 2018-01-16 DIAGNOSIS — H2513 Age-related nuclear cataract, bilateral: Secondary | ICD-10-CM | POA: Diagnosis not present

## 2018-01-20 DIAGNOSIS — J301 Allergic rhinitis due to pollen: Secondary | ICD-10-CM | POA: Diagnosis not present

## 2018-01-21 DIAGNOSIS — J3089 Other allergic rhinitis: Secondary | ICD-10-CM | POA: Diagnosis not present

## 2018-01-21 DIAGNOSIS — J301 Allergic rhinitis due to pollen: Secondary | ICD-10-CM | POA: Diagnosis not present

## 2018-01-21 DIAGNOSIS — R05 Cough: Secondary | ICD-10-CM | POA: Diagnosis not present

## 2018-01-21 DIAGNOSIS — J3081 Allergic rhinitis due to animal (cat) (dog) hair and dander: Secondary | ICD-10-CM | POA: Diagnosis not present

## 2018-01-28 DIAGNOSIS — F331 Major depressive disorder, recurrent, moderate: Secondary | ICD-10-CM | POA: Diagnosis not present

## 2018-01-28 DIAGNOSIS — F900 Attention-deficit hyperactivity disorder, predominantly inattentive type: Secondary | ICD-10-CM | POA: Diagnosis not present

## 2018-02-03 DIAGNOSIS — M25551 Pain in right hip: Secondary | ICD-10-CM | POA: Diagnosis not present

## 2018-02-05 DIAGNOSIS — E890 Postprocedural hypothyroidism: Secondary | ICD-10-CM | POA: Diagnosis not present

## 2018-02-07 DIAGNOSIS — J3081 Allergic rhinitis due to animal (cat) (dog) hair and dander: Secondary | ICD-10-CM | POA: Diagnosis not present

## 2018-02-07 DIAGNOSIS — J301 Allergic rhinitis due to pollen: Secondary | ICD-10-CM | POA: Diagnosis not present

## 2018-02-07 DIAGNOSIS — J3089 Other allergic rhinitis: Secondary | ICD-10-CM | POA: Diagnosis not present

## 2018-02-12 DIAGNOSIS — E161 Other hypoglycemia: Secondary | ICD-10-CM | POA: Diagnosis not present

## 2018-02-12 DIAGNOSIS — E210 Primary hyperparathyroidism: Secondary | ICD-10-CM | POA: Diagnosis not present

## 2018-02-12 DIAGNOSIS — Z780 Asymptomatic menopausal state: Secondary | ICD-10-CM | POA: Diagnosis not present

## 2018-02-14 DIAGNOSIS — J301 Allergic rhinitis due to pollen: Secondary | ICD-10-CM | POA: Diagnosis not present

## 2018-02-14 DIAGNOSIS — J3081 Allergic rhinitis due to animal (cat) (dog) hair and dander: Secondary | ICD-10-CM | POA: Diagnosis not present

## 2018-02-14 DIAGNOSIS — J3089 Other allergic rhinitis: Secondary | ICD-10-CM | POA: Diagnosis not present

## 2018-02-15 ENCOUNTER — Ambulatory Visit (HOSPITAL_COMMUNITY): Payer: BLUE CROSS/BLUE SHIELD | Admitting: Psychiatry

## 2018-02-21 DIAGNOSIS — J301 Allergic rhinitis due to pollen: Secondary | ICD-10-CM | POA: Diagnosis not present

## 2018-02-21 DIAGNOSIS — J3081 Allergic rhinitis due to animal (cat) (dog) hair and dander: Secondary | ICD-10-CM | POA: Diagnosis not present

## 2018-02-21 DIAGNOSIS — J3089 Other allergic rhinitis: Secondary | ICD-10-CM | POA: Diagnosis not present

## 2018-02-28 DIAGNOSIS — J3081 Allergic rhinitis due to animal (cat) (dog) hair and dander: Secondary | ICD-10-CM | POA: Diagnosis not present

## 2018-02-28 DIAGNOSIS — J301 Allergic rhinitis due to pollen: Secondary | ICD-10-CM | POA: Diagnosis not present

## 2018-02-28 DIAGNOSIS — J3089 Other allergic rhinitis: Secondary | ICD-10-CM | POA: Diagnosis not present

## 2018-03-07 DIAGNOSIS — J301 Allergic rhinitis due to pollen: Secondary | ICD-10-CM | POA: Diagnosis not present

## 2018-03-07 DIAGNOSIS — J3089 Other allergic rhinitis: Secondary | ICD-10-CM | POA: Diagnosis not present

## 2018-03-07 DIAGNOSIS — J3081 Allergic rhinitis due to animal (cat) (dog) hair and dander: Secondary | ICD-10-CM | POA: Diagnosis not present

## 2018-03-14 DIAGNOSIS — J3081 Allergic rhinitis due to animal (cat) (dog) hair and dander: Secondary | ICD-10-CM | POA: Diagnosis not present

## 2018-03-14 DIAGNOSIS — J3089 Other allergic rhinitis: Secondary | ICD-10-CM | POA: Diagnosis not present

## 2018-03-14 DIAGNOSIS — J301 Allergic rhinitis due to pollen: Secondary | ICD-10-CM | POA: Diagnosis not present

## 2018-03-21 DIAGNOSIS — J301 Allergic rhinitis due to pollen: Secondary | ICD-10-CM | POA: Diagnosis not present

## 2018-03-21 DIAGNOSIS — J3081 Allergic rhinitis due to animal (cat) (dog) hair and dander: Secondary | ICD-10-CM | POA: Diagnosis not present

## 2018-03-21 DIAGNOSIS — J3089 Other allergic rhinitis: Secondary | ICD-10-CM | POA: Diagnosis not present

## 2018-04-01 DIAGNOSIS — F908 Attention-deficit hyperactivity disorder, other type: Secondary | ICD-10-CM | POA: Diagnosis not present

## 2018-04-01 DIAGNOSIS — E213 Hyperparathyroidism, unspecified: Secondary | ICD-10-CM | POA: Diagnosis not present

## 2018-04-01 DIAGNOSIS — E039 Hypothyroidism, unspecified: Secondary | ICD-10-CM | POA: Diagnosis not present

## 2018-04-01 DIAGNOSIS — R768 Other specified abnormal immunological findings in serum: Secondary | ICD-10-CM | POA: Diagnosis not present

## 2018-04-04 DIAGNOSIS — J3089 Other allergic rhinitis: Secondary | ICD-10-CM | POA: Diagnosis not present

## 2018-04-04 DIAGNOSIS — J301 Allergic rhinitis due to pollen: Secondary | ICD-10-CM | POA: Diagnosis not present

## 2018-04-04 DIAGNOSIS — J3081 Allergic rhinitis due to animal (cat) (dog) hair and dander: Secondary | ICD-10-CM | POA: Diagnosis not present

## 2018-04-10 DIAGNOSIS — J3089 Other allergic rhinitis: Secondary | ICD-10-CM | POA: Diagnosis not present

## 2018-04-10 DIAGNOSIS — J301 Allergic rhinitis due to pollen: Secondary | ICD-10-CM | POA: Diagnosis not present

## 2018-04-10 DIAGNOSIS — J3081 Allergic rhinitis due to animal (cat) (dog) hair and dander: Secondary | ICD-10-CM | POA: Diagnosis not present

## 2018-04-24 DIAGNOSIS — J3081 Allergic rhinitis due to animal (cat) (dog) hair and dander: Secondary | ICD-10-CM | POA: Diagnosis not present

## 2018-04-24 DIAGNOSIS — J3089 Other allergic rhinitis: Secondary | ICD-10-CM | POA: Diagnosis not present

## 2018-04-24 DIAGNOSIS — M3500 Sicca syndrome, unspecified: Secondary | ICD-10-CM | POA: Diagnosis not present

## 2018-04-24 DIAGNOSIS — E213 Hyperparathyroidism, unspecified: Secondary | ICD-10-CM | POA: Diagnosis not present

## 2018-04-24 DIAGNOSIS — F339 Major depressive disorder, recurrent, unspecified: Secondary | ICD-10-CM | POA: Diagnosis not present

## 2018-04-24 DIAGNOSIS — J301 Allergic rhinitis due to pollen: Secondary | ICD-10-CM | POA: Diagnosis not present

## 2018-04-24 DIAGNOSIS — K760 Fatty (change of) liver, not elsewhere classified: Secondary | ICD-10-CM | POA: Diagnosis not present

## 2018-04-30 DIAGNOSIS — F329 Major depressive disorder, single episode, unspecified: Secondary | ICD-10-CM | POA: Diagnosis not present

## 2018-05-07 DIAGNOSIS — F418 Other specified anxiety disorders: Secondary | ICD-10-CM | POA: Diagnosis not present

## 2018-05-07 DIAGNOSIS — Z0000 Encounter for general adult medical examination without abnormal findings: Secondary | ICD-10-CM | POA: Diagnosis not present

## 2018-05-07 DIAGNOSIS — Z1211 Encounter for screening for malignant neoplasm of colon: Secondary | ICD-10-CM | POA: Diagnosis not present

## 2018-05-07 DIAGNOSIS — E039 Hypothyroidism, unspecified: Secondary | ICD-10-CM | POA: Diagnosis not present

## 2018-05-09 DIAGNOSIS — J3081 Allergic rhinitis due to animal (cat) (dog) hair and dander: Secondary | ICD-10-CM | POA: Diagnosis not present

## 2018-05-09 DIAGNOSIS — J3089 Other allergic rhinitis: Secondary | ICD-10-CM | POA: Diagnosis not present

## 2018-05-09 DIAGNOSIS — J301 Allergic rhinitis due to pollen: Secondary | ICD-10-CM | POA: Diagnosis not present

## 2018-06-05 DIAGNOSIS — J301 Allergic rhinitis due to pollen: Secondary | ICD-10-CM | POA: Diagnosis not present

## 2018-06-05 DIAGNOSIS — J3081 Allergic rhinitis due to animal (cat) (dog) hair and dander: Secondary | ICD-10-CM | POA: Diagnosis not present

## 2018-06-05 DIAGNOSIS — J3089 Other allergic rhinitis: Secondary | ICD-10-CM | POA: Diagnosis not present

## 2018-06-16 DIAGNOSIS — Z8041 Family history of malignant neoplasm of ovary: Secondary | ICD-10-CM | POA: Diagnosis not present

## 2018-06-20 DIAGNOSIS — J3081 Allergic rhinitis due to animal (cat) (dog) hair and dander: Secondary | ICD-10-CM | POA: Diagnosis not present

## 2018-06-20 DIAGNOSIS — J3089 Other allergic rhinitis: Secondary | ICD-10-CM | POA: Diagnosis not present

## 2018-06-20 DIAGNOSIS — J301 Allergic rhinitis due to pollen: Secondary | ICD-10-CM | POA: Diagnosis not present

## 2018-06-27 DIAGNOSIS — J3089 Other allergic rhinitis: Secondary | ICD-10-CM | POA: Diagnosis not present

## 2018-06-27 DIAGNOSIS — J3081 Allergic rhinitis due to animal (cat) (dog) hair and dander: Secondary | ICD-10-CM | POA: Diagnosis not present

## 2018-06-27 DIAGNOSIS — J301 Allergic rhinitis due to pollen: Secondary | ICD-10-CM | POA: Diagnosis not present

## 2018-07-02 DIAGNOSIS — F900 Attention-deficit hyperactivity disorder, predominantly inattentive type: Secondary | ICD-10-CM | POA: Insufficient documentation

## 2018-07-08 DIAGNOSIS — D229 Melanocytic nevi, unspecified: Secondary | ICD-10-CM | POA: Diagnosis not present

## 2018-07-08 DIAGNOSIS — L570 Actinic keratosis: Secondary | ICD-10-CM | POA: Diagnosis not present

## 2018-07-10 DIAGNOSIS — M1611 Unilateral primary osteoarthritis, right hip: Secondary | ICD-10-CM | POA: Diagnosis not present

## 2018-07-10 DIAGNOSIS — M25551 Pain in right hip: Secondary | ICD-10-CM | POA: Diagnosis not present

## 2018-07-22 DIAGNOSIS — F411 Generalized anxiety disorder: Secondary | ICD-10-CM | POA: Diagnosis not present

## 2018-07-22 DIAGNOSIS — F900 Attention-deficit hyperactivity disorder, predominantly inattentive type: Secondary | ICD-10-CM | POA: Diagnosis not present

## 2018-07-22 DIAGNOSIS — F331 Major depressive disorder, recurrent, moderate: Secondary | ICD-10-CM | POA: Diagnosis not present

## 2018-07-25 DIAGNOSIS — J3089 Other allergic rhinitis: Secondary | ICD-10-CM | POA: Diagnosis not present

## 2018-07-25 DIAGNOSIS — J3081 Allergic rhinitis due to animal (cat) (dog) hair and dander: Secondary | ICD-10-CM | POA: Diagnosis not present

## 2018-07-25 DIAGNOSIS — J301 Allergic rhinitis due to pollen: Secondary | ICD-10-CM | POA: Diagnosis not present

## 2018-08-15 DIAGNOSIS — J301 Allergic rhinitis due to pollen: Secondary | ICD-10-CM | POA: Diagnosis not present

## 2018-08-15 DIAGNOSIS — J3089 Other allergic rhinitis: Secondary | ICD-10-CM | POA: Diagnosis not present

## 2018-08-15 DIAGNOSIS — J3081 Allergic rhinitis due to animal (cat) (dog) hair and dander: Secondary | ICD-10-CM | POA: Diagnosis not present

## 2018-08-20 DIAGNOSIS — R4586 Emotional lability: Secondary | ICD-10-CM | POA: Diagnosis not present

## 2018-08-20 DIAGNOSIS — E213 Hyperparathyroidism, unspecified: Secondary | ICD-10-CM | POA: Diagnosis not present

## 2018-08-20 DIAGNOSIS — E890 Postprocedural hypothyroidism: Secondary | ICD-10-CM | POA: Diagnosis not present

## 2018-08-20 DIAGNOSIS — E210 Primary hyperparathyroidism: Secondary | ICD-10-CM | POA: Diagnosis not present

## 2018-08-20 DIAGNOSIS — E161 Other hypoglycemia: Secondary | ICD-10-CM | POA: Diagnosis not present

## 2018-08-20 DIAGNOSIS — Z780 Asymptomatic menopausal state: Secondary | ICD-10-CM | POA: Diagnosis not present

## 2018-08-20 DIAGNOSIS — E041 Nontoxic single thyroid nodule: Secondary | ICD-10-CM | POA: Diagnosis not present

## 2018-08-20 DIAGNOSIS — E039 Hypothyroidism, unspecified: Secondary | ICD-10-CM | POA: Diagnosis not present

## 2018-08-20 DIAGNOSIS — M85851 Other specified disorders of bone density and structure, right thigh: Secondary | ICD-10-CM | POA: Diagnosis not present

## 2018-08-20 DIAGNOSIS — F17210 Nicotine dependence, cigarettes, uncomplicated: Secondary | ICD-10-CM | POA: Diagnosis not present

## 2018-09-12 DIAGNOSIS — J3081 Allergic rhinitis due to animal (cat) (dog) hair and dander: Secondary | ICD-10-CM | POA: Diagnosis not present

## 2018-09-12 DIAGNOSIS — J3089 Other allergic rhinitis: Secondary | ICD-10-CM | POA: Diagnosis not present

## 2018-09-12 DIAGNOSIS — J301 Allergic rhinitis due to pollen: Secondary | ICD-10-CM | POA: Diagnosis not present

## 2018-09-16 DIAGNOSIS — K760 Fatty (change of) liver, not elsewhere classified: Secondary | ICD-10-CM | POA: Diagnosis not present

## 2018-09-16 DIAGNOSIS — M1611 Unilateral primary osteoarthritis, right hip: Secondary | ICD-10-CM | POA: Diagnosis not present

## 2018-09-16 DIAGNOSIS — R194 Change in bowel habit: Secondary | ICD-10-CM | POA: Diagnosis not present

## 2018-09-16 DIAGNOSIS — Z1211 Encounter for screening for malignant neoplasm of colon: Secondary | ICD-10-CM | POA: Diagnosis not present

## 2018-09-16 DIAGNOSIS — M25551 Pain in right hip: Secondary | ICD-10-CM | POA: Diagnosis not present

## 2018-09-16 DIAGNOSIS — K5730 Diverticulosis of large intestine without perforation or abscess without bleeding: Secondary | ICD-10-CM | POA: Diagnosis not present

## 2018-09-19 DIAGNOSIS — J301 Allergic rhinitis due to pollen: Secondary | ICD-10-CM | POA: Diagnosis not present

## 2018-09-19 DIAGNOSIS — J3081 Allergic rhinitis due to animal (cat) (dog) hair and dander: Secondary | ICD-10-CM | POA: Diagnosis not present

## 2018-09-19 DIAGNOSIS — J3089 Other allergic rhinitis: Secondary | ICD-10-CM | POA: Diagnosis not present

## 2018-10-16 DIAGNOSIS — J301 Allergic rhinitis due to pollen: Secondary | ICD-10-CM | POA: Diagnosis not present

## 2018-10-16 DIAGNOSIS — J3081 Allergic rhinitis due to animal (cat) (dog) hair and dander: Secondary | ICD-10-CM | POA: Diagnosis not present

## 2018-10-16 DIAGNOSIS — J3089 Other allergic rhinitis: Secondary | ICD-10-CM | POA: Diagnosis not present

## 2018-11-07 DIAGNOSIS — J301 Allergic rhinitis due to pollen: Secondary | ICD-10-CM | POA: Diagnosis not present

## 2018-11-07 DIAGNOSIS — J3089 Other allergic rhinitis: Secondary | ICD-10-CM | POA: Diagnosis not present

## 2018-11-07 DIAGNOSIS — J3081 Allergic rhinitis due to animal (cat) (dog) hair and dander: Secondary | ICD-10-CM | POA: Diagnosis not present

## 2018-11-10 DIAGNOSIS — K635 Polyp of colon: Secondary | ICD-10-CM | POA: Diagnosis not present

## 2018-11-10 DIAGNOSIS — Z1211 Encounter for screening for malignant neoplasm of colon: Secondary | ICD-10-CM | POA: Diagnosis not present

## 2018-11-10 DIAGNOSIS — R194 Change in bowel habit: Secondary | ICD-10-CM | POA: Diagnosis not present

## 2018-11-10 DIAGNOSIS — D123 Benign neoplasm of transverse colon: Secondary | ICD-10-CM | POA: Diagnosis not present

## 2018-11-10 DIAGNOSIS — D122 Benign neoplasm of ascending colon: Secondary | ICD-10-CM | POA: Diagnosis not present

## 2018-11-12 DIAGNOSIS — F3342 Major depressive disorder, recurrent, in full remission: Secondary | ICD-10-CM | POA: Diagnosis not present

## 2018-11-13 DIAGNOSIS — J3081 Allergic rhinitis due to animal (cat) (dog) hair and dander: Secondary | ICD-10-CM | POA: Diagnosis not present

## 2018-11-13 DIAGNOSIS — J301 Allergic rhinitis due to pollen: Secondary | ICD-10-CM | POA: Diagnosis not present

## 2018-11-13 DIAGNOSIS — J3089 Other allergic rhinitis: Secondary | ICD-10-CM | POA: Diagnosis not present

## 2018-11-21 DIAGNOSIS — J3081 Allergic rhinitis due to animal (cat) (dog) hair and dander: Secondary | ICD-10-CM | POA: Diagnosis not present

## 2018-11-21 DIAGNOSIS — J301 Allergic rhinitis due to pollen: Secondary | ICD-10-CM | POA: Diagnosis not present

## 2018-11-21 DIAGNOSIS — J3089 Other allergic rhinitis: Secondary | ICD-10-CM | POA: Diagnosis not present

## 2018-11-26 DIAGNOSIS — E041 Nontoxic single thyroid nodule: Secondary | ICD-10-CM | POA: Diagnosis not present

## 2018-11-28 DIAGNOSIS — J3089 Other allergic rhinitis: Secondary | ICD-10-CM | POA: Diagnosis not present

## 2018-11-28 DIAGNOSIS — J3081 Allergic rhinitis due to animal (cat) (dog) hair and dander: Secondary | ICD-10-CM | POA: Diagnosis not present

## 2018-11-28 DIAGNOSIS — J301 Allergic rhinitis due to pollen: Secondary | ICD-10-CM | POA: Diagnosis not present

## 2018-12-03 DIAGNOSIS — J301 Allergic rhinitis due to pollen: Secondary | ICD-10-CM | POA: Diagnosis not present

## 2018-12-04 DIAGNOSIS — J3081 Allergic rhinitis due to animal (cat) (dog) hair and dander: Secondary | ICD-10-CM | POA: Diagnosis not present

## 2018-12-04 DIAGNOSIS — J3089 Other allergic rhinitis: Secondary | ICD-10-CM | POA: Diagnosis not present

## 2018-12-05 DIAGNOSIS — J3089 Other allergic rhinitis: Secondary | ICD-10-CM | POA: Diagnosis not present

## 2018-12-05 DIAGNOSIS — J301 Allergic rhinitis due to pollen: Secondary | ICD-10-CM | POA: Diagnosis not present

## 2018-12-05 DIAGNOSIS — J3081 Allergic rhinitis due to animal (cat) (dog) hair and dander: Secondary | ICD-10-CM | POA: Diagnosis not present

## 2018-12-11 DIAGNOSIS — J3081 Allergic rhinitis due to animal (cat) (dog) hair and dander: Secondary | ICD-10-CM | POA: Diagnosis not present

## 2018-12-11 DIAGNOSIS — J3089 Other allergic rhinitis: Secondary | ICD-10-CM | POA: Diagnosis not present

## 2018-12-11 DIAGNOSIS — J301 Allergic rhinitis due to pollen: Secondary | ICD-10-CM | POA: Diagnosis not present

## 2018-12-16 DIAGNOSIS — F900 Attention-deficit hyperactivity disorder, predominantly inattentive type: Secondary | ICD-10-CM | POA: Diagnosis not present

## 2018-12-16 DIAGNOSIS — Z0001 Encounter for general adult medical examination with abnormal findings: Secondary | ICD-10-CM | POA: Diagnosis not present

## 2018-12-19 ENCOUNTER — Other Ambulatory Visit: Payer: Self-pay | Admitting: Family Medicine

## 2018-12-19 DIAGNOSIS — Z1231 Encounter for screening mammogram for malignant neoplasm of breast: Secondary | ICD-10-CM

## 2018-12-19 DIAGNOSIS — J3081 Allergic rhinitis due to animal (cat) (dog) hair and dander: Secondary | ICD-10-CM | POA: Diagnosis not present

## 2018-12-19 DIAGNOSIS — J301 Allergic rhinitis due to pollen: Secondary | ICD-10-CM | POA: Diagnosis not present

## 2018-12-19 DIAGNOSIS — J3089 Other allergic rhinitis: Secondary | ICD-10-CM | POA: Diagnosis not present

## 2018-12-22 ENCOUNTER — Other Ambulatory Visit: Payer: Self-pay

## 2018-12-22 ENCOUNTER — Other Ambulatory Visit: Payer: Self-pay | Admitting: Family Medicine

## 2018-12-22 ENCOUNTER — Ambulatory Visit
Admission: RE | Admit: 2018-12-22 | Discharge: 2018-12-22 | Disposition: A | Discharge status: Home / Self Care | Payer: BC Managed Care – PPO | Source: Ambulatory Visit | Attending: Family Medicine | Admitting: Family Medicine

## 2018-12-22 DIAGNOSIS — J3089 Other allergic rhinitis: Secondary | ICD-10-CM | POA: Diagnosis not present

## 2018-12-22 DIAGNOSIS — Z720 Tobacco use: Secondary | ICD-10-CM

## 2018-12-22 DIAGNOSIS — Z1231 Encounter for screening mammogram for malignant neoplasm of breast: Secondary | ICD-10-CM

## 2018-12-22 DIAGNOSIS — J3081 Allergic rhinitis due to animal (cat) (dog) hair and dander: Secondary | ICD-10-CM | POA: Diagnosis not present

## 2018-12-22 DIAGNOSIS — J301 Allergic rhinitis due to pollen: Secondary | ICD-10-CM | POA: Diagnosis not present

## 2018-12-23 DIAGNOSIS — Z1388 Encounter for screening for disorder due to exposure to contaminants: Secondary | ICD-10-CM | POA: Diagnosis not present

## 2018-12-23 DIAGNOSIS — Z0001 Encounter for general adult medical examination with abnormal findings: Secondary | ICD-10-CM | POA: Diagnosis not present

## 2018-12-23 DIAGNOSIS — Z0000 Encounter for general adult medical examination without abnormal findings: Secondary | ICD-10-CM | POA: Diagnosis not present

## 2018-12-25 DIAGNOSIS — J3089 Other allergic rhinitis: Secondary | ICD-10-CM | POA: Diagnosis not present

## 2018-12-25 DIAGNOSIS — J3081 Allergic rhinitis due to animal (cat) (dog) hair and dander: Secondary | ICD-10-CM | POA: Diagnosis not present

## 2018-12-25 DIAGNOSIS — J301 Allergic rhinitis due to pollen: Secondary | ICD-10-CM | POA: Diagnosis not present

## 2018-12-30 DIAGNOSIS — Z8041 Family history of malignant neoplasm of ovary: Secondary | ICD-10-CM | POA: Diagnosis not present

## 2018-12-30 DIAGNOSIS — J3081 Allergic rhinitis due to animal (cat) (dog) hair and dander: Secondary | ICD-10-CM | POA: Diagnosis not present

## 2018-12-30 DIAGNOSIS — R971 Elevated cancer antigen 125 [CA 125]: Secondary | ICD-10-CM | POA: Diagnosis not present

## 2018-12-30 DIAGNOSIS — Z01411 Encounter for gynecological examination (general) (routine) with abnormal findings: Secondary | ICD-10-CM | POA: Diagnosis not present

## 2018-12-30 DIAGNOSIS — J301 Allergic rhinitis due to pollen: Secondary | ICD-10-CM | POA: Diagnosis not present

## 2018-12-30 DIAGNOSIS — J3089 Other allergic rhinitis: Secondary | ICD-10-CM | POA: Diagnosis not present

## 2019-01-01 DIAGNOSIS — J3089 Other allergic rhinitis: Secondary | ICD-10-CM | POA: Diagnosis not present

## 2019-01-01 DIAGNOSIS — J301 Allergic rhinitis due to pollen: Secondary | ICD-10-CM | POA: Diagnosis not present

## 2019-01-06 DIAGNOSIS — R4189 Other symptoms and signs involving cognitive functions and awareness: Secondary | ICD-10-CM | POA: Diagnosis not present

## 2019-01-06 DIAGNOSIS — R413 Other amnesia: Secondary | ICD-10-CM | POA: Diagnosis not present

## 2019-01-06 DIAGNOSIS — F17290 Nicotine dependence, other tobacco product, uncomplicated: Secondary | ICD-10-CM | POA: Diagnosis not present

## 2019-01-09 DIAGNOSIS — J3089 Other allergic rhinitis: Secondary | ICD-10-CM | POA: Diagnosis not present

## 2019-01-09 DIAGNOSIS — J3081 Allergic rhinitis due to animal (cat) (dog) hair and dander: Secondary | ICD-10-CM | POA: Diagnosis not present

## 2019-01-09 DIAGNOSIS — J301 Allergic rhinitis due to pollen: Secondary | ICD-10-CM | POA: Diagnosis not present

## 2019-01-15 DIAGNOSIS — J3089 Other allergic rhinitis: Secondary | ICD-10-CM | POA: Diagnosis not present

## 2019-01-15 DIAGNOSIS — J301 Allergic rhinitis due to pollen: Secondary | ICD-10-CM | POA: Diagnosis not present

## 2019-01-15 DIAGNOSIS — J3081 Allergic rhinitis due to animal (cat) (dog) hair and dander: Secondary | ICD-10-CM | POA: Diagnosis not present

## 2019-01-20 DIAGNOSIS — R05 Cough: Secondary | ICD-10-CM | POA: Diagnosis not present

## 2019-01-20 DIAGNOSIS — J3081 Allergic rhinitis due to animal (cat) (dog) hair and dander: Secondary | ICD-10-CM | POA: Diagnosis not present

## 2019-01-20 DIAGNOSIS — J301 Allergic rhinitis due to pollen: Secondary | ICD-10-CM | POA: Diagnosis not present

## 2019-01-20 DIAGNOSIS — J3089 Other allergic rhinitis: Secondary | ICD-10-CM | POA: Diagnosis not present

## 2019-01-30 DIAGNOSIS — J3089 Other allergic rhinitis: Secondary | ICD-10-CM | POA: Diagnosis not present

## 2019-01-30 DIAGNOSIS — J3081 Allergic rhinitis due to animal (cat) (dog) hair and dander: Secondary | ICD-10-CM | POA: Diagnosis not present

## 2019-01-30 DIAGNOSIS — J301 Allergic rhinitis due to pollen: Secondary | ICD-10-CM | POA: Diagnosis not present

## 2019-02-06 DIAGNOSIS — J3089 Other allergic rhinitis: Secondary | ICD-10-CM | POA: Diagnosis not present

## 2019-02-06 DIAGNOSIS — J301 Allergic rhinitis due to pollen: Secondary | ICD-10-CM | POA: Diagnosis not present

## 2019-02-06 DIAGNOSIS — J3081 Allergic rhinitis due to animal (cat) (dog) hair and dander: Secondary | ICD-10-CM | POA: Diagnosis not present

## 2019-02-13 DIAGNOSIS — J301 Allergic rhinitis due to pollen: Secondary | ICD-10-CM | POA: Diagnosis not present

## 2019-02-13 DIAGNOSIS — J3081 Allergic rhinitis due to animal (cat) (dog) hair and dander: Secondary | ICD-10-CM | POA: Diagnosis not present

## 2019-02-13 DIAGNOSIS — J3089 Other allergic rhinitis: Secondary | ICD-10-CM | POA: Diagnosis not present

## 2019-02-18 DIAGNOSIS — E213 Hyperparathyroidism, unspecified: Secondary | ICD-10-CM | POA: Diagnosis not present

## 2019-02-20 ENCOUNTER — Telehealth: Payer: Self-pay | Admitting: Orthopedic Surgery

## 2019-02-20 DIAGNOSIS — J3089 Other allergic rhinitis: Secondary | ICD-10-CM | POA: Diagnosis not present

## 2019-02-20 DIAGNOSIS — J301 Allergic rhinitis due to pollen: Secondary | ICD-10-CM | POA: Diagnosis not present

## 2019-02-20 DIAGNOSIS — J3081 Allergic rhinitis due to animal (cat) (dog) hair and dander: Secondary | ICD-10-CM | POA: Diagnosis not present

## 2019-02-20 NOTE — Telephone Encounter (Signed)
Patient called to inquire about scheduling a 2nd opinion evaluation with Dr Aline Brochure. States she has records and films; states has not yet opened, so assuming all is there for Dr Aline Brochure to review and advise. She will bring them next week. Aware she will be called back once Dr has reviewed - phone# [phone].

## 2019-02-26 ENCOUNTER — Ambulatory Visit: Payer: BC Managed Care – PPO | Admitting: Psychology

## 2019-02-26 ENCOUNTER — Ambulatory Visit (INDEPENDENT_AMBULATORY_CARE_PROVIDER_SITE_OTHER): Payer: BC Managed Care – PPO | Admitting: Psychology

## 2019-02-26 ENCOUNTER — Encounter: Payer: Self-pay | Admitting: Orthopedic Surgery

## 2019-02-26 ENCOUNTER — Encounter: Payer: Self-pay | Admitting: Psychology

## 2019-02-26 ENCOUNTER — Other Ambulatory Visit: Payer: Self-pay

## 2019-02-26 DIAGNOSIS — F329 Major depressive disorder, single episode, unspecified: Secondary | ICD-10-CM | POA: Insufficient documentation

## 2019-02-26 DIAGNOSIS — F411 Generalized anxiety disorder: Secondary | ICD-10-CM

## 2019-02-26 DIAGNOSIS — F331 Major depressive disorder, recurrent, moderate: Secondary | ICD-10-CM | POA: Diagnosis not present

## 2019-02-26 DIAGNOSIS — F900 Attention-deficit hyperactivity disorder, predominantly inattentive type: Secondary | ICD-10-CM

## 2019-02-26 DIAGNOSIS — F330 Major depressive disorder, recurrent, mild: Secondary | ICD-10-CM

## 2019-02-26 NOTE — Progress Notes (Signed)
NEUROPSYCHOLOGICAL EVALUATION [place]. [facility] Department of Neurology  Reason for Referral:   [name] [name] is a 63 y.o. Caucasian female referred by Horald Pollen, M.D., to characterize her current cognitive functioning and assist with diagnostic clarity and treatment planning in the context of subjective cognitive decline and a history of hyperparathyroidism s/p partial thyroidectomy, ADHD, and several psychiatric comorbidities.  Assessment and Plan:   Clinical Impression(s): Overall, [name] [name]'s pattern of performance is suggestive of neuropsychological functioning within normal limits. A relative weakness was exhibited across a single task assessing phonemic fluency; however, scores across other expressive language tasks were within normal limits. Likewise, performance across cognitive domains of processing speed, attention/concentration, executive functioning, receptive language, visuospatial abilities, and learning and memory were within normal limits.  Responses across self-report questionnaires suggested that recent symptoms of depression were in the severe range, while recent symptoms of anxiety were in the moderate range. As such, she continues to meet criteria for Major Depressive Disorder, moderate to severe, as well as Generalized Anxiety Disorder. Significant and non-optimally managed psychiatric illness can directly influence cognitive efficiency, cause day-to-day cognitive difficulties, and, along with ADHD symptoms, is likely the primary cause of difficulties which [name] [name] has been experiencing lately.  Importantly, the absence of cognitive deficits should not be interpreted as absence of ADHD as there is no pattern of performance across cognitive testing that is specific to ADHD. Individuals with ADHD can perform strongly in testing environments, likely due to the highly structured and distraction free setting in which testing commences.    Recommendations: -A combination of medication and psychotherapy has been shown to be most effective at treating symptoms of anxiety and depression. [name] [name] is encouraged to speak with her prescribing physician regarding possible medication adjustments to better manage these conditions.  -She would benefit from short-term outpatient psychotherapy to aid in the alleviation of psychiatric distress and in developing various coping strategies. She would benefit from active and collaborative therapeutic approaches (i.e., Cognitive Behavioral Therapy [CBT] or Acceptance and Commitment Therapy [ACT]).  -Memory can be improved using internal strategies such as rehearsal, repetition, chunking, mnemonics, association, and imagery. External strategies such as written notes in a consistently used memory journal, visual and nonverbal auditory cues such as a calendar on the refrigerator or appointments with alarm, such as on a cell phone, can also help maximize recall.    To address problems with fluctuating attention, she may wish to consider:   -Avoiding external distractions (TV, music, background noise/conversations, etc.) when needing to concentrate   -Limiting exposure to fast paced environments with multiple sensory demands   -Writing down complicated information and using checklists, rather than holding everything in mind   -Attempting and completing one task at a time before moving on to the next step/task (i.e., no multi-tasking)   -Verbalizing aloud each step of a task to maintain focus   -Reducing the amount of information considered at one time  Review of Records:   [name] [name] was seen by [facility] Alice Rieger, M.D.) on 02/18/2019 for follow-up of hyperparathyroidism and thyroid nodule. Prior to her work-up for primary hyperparathyroidism, she experienced symptoms including memory issues, memory fog, ADHD, bone pain, body aches, muscle pain, and emotional lability. She  was also noted to have hypercalcemia. A partial thyroidectomy was performed in November 2018. During the current visit, [name] [name] described persisting memory issues and cognitive changes, as well as symptoms of fatigue, cold intolerance, and decreased energy. She also noted  a diminished appetite and concerns regarding hypoglycemia. Her primary care physician Horald Pollen, M.D.) referred [name] [name] for a comprehensive neuropsychological evaluation to characterize her cognitive abilities and to assist with diagnostic clarity and treatment planning.  [name] [name] was seen by [facility] Andrey Spearman, M.D.) on 08/03/2017 for follow-up of subjective memory difficulties. During prior visits, [name] [name] reported a lifelong history of ADHD and was officially diagnosed as an adult in 2000 at [facility]. She was started on medications (Adderall) at that time. Regarding cognitive deficits, difficulties with short-term memory loss, word-finding, and attention/concentration were endorsed. These were said to have worsened since 2010. Additionally, she reported a prior history of depression, as well as the emergence of several significant psychosocial stressors, including her sister passing away in 2015, her mother passing away in 2016, and family conflicts surrounding the handling of her mother's estate. [name] [name] also reported a history of multiple sclerosis, stemming from an evaluation performed almost 20 years ago due to presence of "spots in the brain." However, she had a spinal tap performed and was not found to have oligoclonal bands. Therefore multiple sclerosis was ruled out at that time. Performance on a brief cognitive screening instrument (MMSE) was within normal limits (30/30).   Brain MRI on 08/17/2016 revealed scattered nonspecific periventricular and subcortical white matter intensities. No enhancing lesions were noted.  Past Medical History:  Diagnosis Date   ADHD (attention  deficit hyperactivity disorder), inattentive type    Anemia    with associated shortness of breath   Generalized anxiety disorder    Hypercalcemia    Hyperparathyroidism (HCC)    Major depressive disorder    Osteopenia    Seasonal allergies    URI (upper respiratory infection)    current cough and head congestion- no fever   Vitamin D deficiency     Past Surgical History:  Procedure Laterality Date   back cysts removed     multiple  removed x 4   BACK SURGERY  2009   Hemi laminectomy   DIAGNOSTIC LAPAROSCOPY     DILATION AND CURETTAGE OF UTERUS  2013   hysteroscopy resect polyp   PARATHYROIDECTOMY     thyroidectomy (partial)   WISDOM TOOTH EXTRACTION      Family History  Problem Relation Age of Onset   Cancer Father    Cancer Sister    Cancer Brother    Cancer Paternal Uncle      Current Outpatient Medications:    amphetamine-dextroamphetamine (ADDERALL) 20 MG tablet, Take 20 mg by mouth 3 (three) times daily as needed. Pt takes up to three times a day for ADD, Disp: , Rfl:    buPROPion (WELLBUTRIN SR) 150 MG 12 hr tablet, Take 150 mg by mouth daily. , Disp: , Rfl:    Calcium Carbonate-Vitamin D (CALCIUM HIGH POTENCY/VITAMIN D) 600-200 MG-UNIT TABS, Take by mouth daily., Disp: , Rfl:    fexofenadine (ALLEGRA) 180 MG tablet, Take 180 mg by mouth daily., Disp: , Rfl:    levothyroxine (SYNTHROID, LEVOTHROID) 100 MCG tablet, Take 100 mcg by mouth daily before breakfast., Disp: , Rfl:    LORazepam (ATIVAN) 1 MG tablet, Takes .25 mg nightly, Disp: , Rfl:    montelukast (SINGULAIR) 10 MG tablet, 10 mg daily., Disp: , Rfl:    Multiple Vitamin (MULTIVITAMIN WITH MINERALS) TABS, Take 1 tablet by mouth daily., Disp: , Rfl:    traZODone (DESYREL) 50 MG tablet, 50 mg at bedtime., Disp: , Rfl:    UNABLE TO  FIND, Med Name: Allergy shots by Dr. Jeni Salles, Disp: , Rfl:   Clinical Interview:   Cognitive Symptoms: Decreased short-term memory: Endorsed. Ms.  Reitnauer described prominent difficulties with generalized forgetfulness, as well as misplacing objects around her home.  Decreased long-term memory: Denied. Decreased attention/concentration: Endorsed. Ms. Flash was diagnosed with ADHD as an adult in 2000. She noted longstanding difficulties with attention/concentration, distractibility, organization, and losing her train of thought. After diagnosis, she was prescribed Adderall and has maintained its use to present day.  Reduced processing speed: Endorsed. Difficulties were noted to be exacerbated during periods of lethargy or decreased energy. Difficulties with executive functions: Endorsed. In addition to trouble with organization, Ms. Muchnik noted trouble with impulsivity at times, as well as indecisiveness and sometimes making questionable decisions.  Difficulties with emotion regulation: Denied since undergoing her partial thyroidectomy surgical procedure in 2018.  Difficulties with receptive language: Endorsed. However, difficulties were largely attributed to symptoms of ADHD. Difficulties with word finding: Endorsed. She reported sporadic episodes of aphasia and ongoing difficulties with word finding. Decreased visuoperceptual ability: Denied.  Trajectory of deficits: Cognitive difficulties were said to have worsened in the five years prior to her diagnosis of hyperparathyroidism and subsequent surgery. Since her surgery, she acknowledged that while these deficits have improved, mild difficulties have persisted and she noted still not feeling back to her pre-surgery cognitive baseline.   Difficulties completing ADLs: Denied.  Additional Medical History: History of traumatic brain injury/concussion: Unclear. Ms. Deross reported hitting her head several times as a young child. However, she did not recall any instances in which she lost consciousness and denied remembering any instances where she experienced persisting post-concussion symptoms.    History of stroke: Denied. History of seizure activity: Denied. History of known exposure to toxins: Denied. Symptoms of chronic pain: Endorsed. She reported notable pain symptoms in her right hip due to arthritis which seem to be gradually worsening over time. Experience of frequent headaches/migraines: Denied. Frequent instances of dizziness/vertigo: Denied.  Sensory changes: Endorsed. Ms. Glosson wears corrective lenses with positive effect. She also reported a history of hearing loss, which seems somewhat improved since her surgery. Other sensory changes/difficulties (e.g., taste or smell) were denied. Balance/coordination difficulties: Endorsed. Symptoms were attributed to her history of arthritis and ongoing right hip pain. She denied a recent history of falls. Other motor difficulties: Denied.  Sleep History: Estimated hours obtained each night: Unclear, likely between 6-8 hours. Difficulties falling asleep: Denied. Difficulties staying asleep: Endorsed. Difficulties were attributed to ongoing right hip pain and subsequent tossing and turning behaviors.  Feels rested and refreshed upon awakening: Endorsed.  History of snoring: Denied. History of waking up gasping for air: Denied. Witnessed breath cessation while asleep: Denied.  History of vivid dreaming: Denied. Excessive movement while asleep: Denied outside of tossing and turning behaviors. Instances of acting out her dreams: Denied.  Psychiatric/Behavioral Health History: Depression: Endorsed. Ms. Daker appeared somewhat unclear if she would consider her past experiences as warranting a formal diagnosis. While she denied symptoms of "feeling sad or weepy," she did acknowledge ongoing symptoms of lethargy, amotivation, and a loss of pleasure. She is currently prescribed Wellbutrin; however, she reported that this was initially started to combat Adderall side effects. Additionally, she reported the presence of several significant  psychosocial stressors, including the loss of both her sister (2015) and mother (2016), as well as a prolonged legal battle over her mother's estate which just reached its conclusion in 2020. Current or remote suicidal ideation, intent, or plan  was denied.  Anxiety: Endorsed. These symptoms appeared to be recent in nature and were generally surrounding recent medical conditions and the persistence of her subjective cognitive difficulties.  Mania: Denied. Trauma History: Denied. Visual/auditory hallucinations: Denied. Delusional thoughts: Denied.  Tobacco: Endorsed. Ms. Oller reported consuming one pack of cigarettes per week. Alcohol: She reported consuming one beer per day. However, alcohol content was said to be below 1%. She denied a history of problematic alcohol use, abuse, or dependence.  Recreational drugs: Denied. Caffeine: Endorsed. She reported consuming two 16oz cups of coffee each morning (both with large amounts of milk).   Academic/Vocational History: Highest level of educational attainment: 18 years. Ms. Denby earned a Master's degree in [place] from the [employer]. 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 This was attributed to significant symptoms of ADHD as a child and difficulty studying and taking notes.   History of developmental delay: Denied. History of grade repetition: Denied. However, she did note spending one semester on academic probation while in college. History of class failures: Denied. Enrollment in special education courses: Denied. History of diagnosed specific learning disability: Denied. History of ADHD: Endorsed. As described above, she was diagnosed with ADHD as an adult in 2000.  Employment: Unemployed. 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   Evaluation Results:   Behavioral Observations: Ms. Dech was unaccompanied, arrived to her appointment on time, and was appropriately dressed and groomed. Observed gait and station were within normal limits. Gross motor functioning appeared intact upon informal observation and no abnormal movements (e.g., tremors) were noted. Her affect was generally relaxed and positive, but did range appropriately given the subject being discussed during the clinical interview or the task at hand during testing procedures. Spontaneous speech was fluent and word finding difficulties were not observed during the clinical interview or testing procedures. Sustained attention was appropriate throughout. Thought processes were coherent and normal in content. However, during the clinical interview, she was tangential in nature, often jumping from topic to topic without a clear connection between the two. During testing, task engagement was adequate and she persisted well when challenged. Overall, Ms. Manninen was cooperative with the clinical interview and subsequent testing procedures.   Adequacy of Effort: The validity of neuropsychological testing is limited by the extent to which the individual being tested may be assumed to have exerted adequate effort during testing. Ms. Norgren expressed her intention to perform to the best of her abilities and exhibited adequate task engagement and persistence. Scores across stand-alone and embedded performance validity measures were within expectation. As such, the results of the current evaluation are believed to be a valid representation of Ms. Herschberger's current cognitive functioning.  Test Results: Ms. Mohammad was fully oriented at the time of the current evaluation.  Intellectual abilities based upon educational and vocational attainment were estimated to be in the average to above average range. Premorbid abilities were estimated to be within the well  above average range based upon a single-word reading test (TOPF).   Processing speed was within normal limits. Basic attention was within normal limits. More complex attention (e.g., working memory) was within normal limits. Performance across tasks assessing executive functions (e.g., cognitive flexibility, response inhibition, nonverbal abstract reasoning, analytical problem solving) were within normal limits.  Assessed receptive language abilities were  within normal limits. Likewise, Ms. Paskett did not exhibit any difficulties comprehending task instructions and answered all questions asked of her appropriately. Assessed expressive language (e.g., verbal fluency and confrontation naming) was within normal limits. A relative weakness was exhibited across a task of phonemic fluency.     Assessed visuospatial/visuoconstructional abilities were within normal limits.    Learning (i.e., encoding) of novel verbal and visual information was within normal limits. Spontaneous delayed recall (i.e., retrieval) of previously learned information was commensurate with performance across initial learning trials. Retention rates were strong across memory measures. Performance across recognition tasks was likewise strong, suggesting evidence for appropriate information consolidation.   Results of emotional screening instruments suggested that recent symptoms of generalized anxiety were in the moderate range, while symptoms of depression were within the severe range, including endorsing the presence of suicidal thoughts without a plan or intent to act within the past two [name].   Tables of Scores:   Note: This summary of test scores accompanies the interpretive report and should not be considered in isolation without reference to the appropriate sections in the text. Descriptors are based on appropriate normative data and may be adjusted based on clinical judgment. The terms impaired and within normal limits (WNL)  are used when a more specific level of functioning cannot be determined.       Effort Testing:   DESCRIPTOR       [employer] (ACS) Word Choice: --- --- Within Expectation  HVLT-R Recognition Discrimination Index: --- --- Within Expectation  BVMT-R Retention Percentage: --- --- Within Expectation  Orientation:      Raw Score Percentile   NAB Orientation, Form 1 29/29 67 Average       Estimated Intellectual Functioning:           Standard Score Percentile   Test of Premorbid Functioning (TOPF): 120 3 Well Above Average       Memory:          Wechsler Memory Scale (WMS-IV):                       Raw Score (Scaled Score) Percentile     Logical Memory I 33/50 (13) 84 Above Average    Logical Memory II 35/50 (15) 95 Well Above Average    Logical Memory Recognition 29/30 >75 Above Average       Hopkins Verbal Learning Test (HVLT-R), Form 1: Raw Score (T Score) Percentile     Total Trials 1-3 30/36 (58) 79 Above Average    Delayed Recall 12/12 (63) 91 Well Above Average    Recognition Discrimination Index 12 (59) 82 Above Average      True Positives 12 --- ---      False Positives 0 --- ---       Brief Visuospatial Memory Test (BVMT-R), Form 1: Raw Score (T Score) Percentile     Total Trials 1-3 23/36 (52) 58 Average    Delayed Recall 10/12 (58) 79 Above Average    Recognition Discrimination Index 6 >16 Within Normal Limits      Recognition Hits 6/6 >16 Within Normal Limits      False Positive Errors 0 >16 Within Normal Limits        Attention/Executive Function:          Trail Making Test (TMT): Raw Score (T Score) Percentile     Part A 29 secs.,  0 errors (47) 38 Average    Part B 85 secs.,  0 errors (43) 25 Average        Scaled Score Percentile   WAIS-IV Coding: 10 50 Average       NAB Attention Module, Form 1: T Score Percentile     Digits Forward 53 62 Average    Digits Backwards 60 84 Above Average       D-KEFS Color-Word Interference Test: Raw  Score (Scaled Score) Percentile     Color Naming 22 secs. (15) 95 Well Above Average    Word Reading 18 secs. (13) 84 Above Average    Inhibition 53 secs. (13) 84 Above Average      Total Errors 0 errors (12) 75 Above Average    Inhibition/Switching 59 secs. (13) 84 Above Average      Total Errors 1 error (12) 75 Above Average       D-KEFS Verbal Fluency Test: Raw Score (Scaled Score) Percentile     Letter Total Correct 25 (7) 16 Below Average    Category Total Correct 53 (17) 99 Exceptionally High    Category Switching Total Correct 18 (17) 99 Exceptionally High    Category Switching Accuracy 17 (16) 98 Exceptionally High      Total Set Loss Errors 0 (13) 84 Above Average      Total Repetition Errors 2 (11) 63 Average       D-KEFS 20 Questions Test: Scaled Score Percentile     Total Weighted Achievement Score 13 84 Above Average    Initial Abstraction Score 11 63 Average       Wisconsin Card Sorting Test [facility]): Raw Score Percentile     Categories (trials) 3 (64) >16 Within Normal Limits    Total Errors 11 75 Above Average    Perseverative Errors 6 75 Above Average    Non-Perseverative Errors 5 75 Above Average    Failure to Maintain Set 2 --- ---       Language:          NAB Language Module, Form 1: T Score Percentile     Auditory Comprehension 55 69 Average    Naming 55 69 Average       Visuospatial/Visuoconstruction:          NAB Spatial Module, Form 1: T Score Percentile     Figure Drawing Copy 70 98 Exceptionally High    Figure Drawing Immediate Recall 70 98 Exceptionally High        Scaled Score Percentile   WAIS-IV Matrix Reasoning: 11 63 Average  WAIS-IV Visual Puzzles: 11 63 Average       Mood and Personality:      Raw Score Percentile   Beck Depression Inventory - II: 71 --- Severe  PROMIS Anxiety Questionnaire: 25 --- Moderate   Informed Consent and Coding/Compliance:   Ms. Reising was provided with a verbal description of the nature and purpose of the  present neuropsychological evaluation. Also reviewed were the foreseeable risks and/or discomforts and benefits of the procedure, limits of confidentiality, and mandatory reporting requirements of this provider. The patient was given the opportunity to ask questions and receive answers about the evaluation. Oral consent to participate was provided by the patient.   This evaluation was conducted by Christia Reading, Ph.D., licensed clinical neuropsychologist. Ms. Yambor completed a 35-minute clinical interview, billed as one unit [phone], and 115 minutes of cognitive testing, billed as one unit [phone] and three additional units 96139. Psychometrist Milana Kidney, B.S. assisted Dr. Melvyn Novas with test administration and scoring procedures. As a  separate and discrete service, Dr. Melvyn Novas spent a total of 180 minutes in interpretation and report writing, billed as one unit 96132 and two units 96133.

## 2019-02-26 NOTE — Progress Notes (Signed)
   Neuropsychology Note   Bianca Dible completed 100 minutes of neuropsychological testing with technician, Milana Kidney, B.S., under the supervision of Dr. Christia Reading, Ph.D., licensed psychologist. The patient did not appear overtly distressed by the testing session, per behavioral observation or via self-report to the technician. Rest breaks were offered.    In considering the patient's current level of functioning, level of presumed impairment, nature of symptoms, emotional and behavioral responses during the interview, level of literacy, and observed level of motivation/effort, a battery of tests was selected and communicated to the psychometrician.   Communication between the psychologist and technician was ongoing throughout the testing session and changes were made as deemed necessary based on patient performance on testing, technician observations and additional pertinent factors such as those listed above.   Raghad Kever will return within approximately two weeks for an interactive feedback session with Dr. Melvyn Novas at which time his test performances, clinical impressions, and treatment recommendations will be reviewed in detail. The patient understands she can contact our office should she require our assistance before this time.   Full report to follow.  100 minutes were spent face-to-face with patient administering standardized tests and 15 minutes were spent scoring (technician). [CPT [id number], [id number]

## 2019-02-27 DIAGNOSIS — J3089 Other allergic rhinitis: Secondary | ICD-10-CM | POA: Diagnosis not present

## 2019-02-27 DIAGNOSIS — J301 Allergic rhinitis due to pollen: Secondary | ICD-10-CM | POA: Diagnosis not present

## 2019-02-27 DIAGNOSIS — J3081 Allergic rhinitis due to animal (cat) (dog) hair and dander: Secondary | ICD-10-CM | POA: Diagnosis not present

## 2019-03-05 ENCOUNTER — Ambulatory Visit (INDEPENDENT_AMBULATORY_CARE_PROVIDER_SITE_OTHER): Payer: BC Managed Care – PPO | Admitting: Psychology

## 2019-03-05 ENCOUNTER — Other Ambulatory Visit: Payer: Self-pay

## 2019-03-05 ENCOUNTER — Encounter: Payer: Self-pay | Admitting: Psychology

## 2019-03-05 DIAGNOSIS — F331 Major depressive disorder, recurrent, moderate: Secondary | ICD-10-CM

## 2019-03-05 DIAGNOSIS — F900 Attention-deficit hyperactivity disorder, predominantly inattentive type: Secondary | ICD-10-CM

## 2019-03-05 DIAGNOSIS — F411 Generalized anxiety disorder: Secondary | ICD-10-CM | POA: Diagnosis not present

## 2019-03-05 NOTE — Progress Notes (Signed)
   NEUROPSYCHOLOGICAL EVALUATION - Feedback [place] Department of Neurology  Reason for Referral:   Quiara Korzeniewski a 63 y.o. Caucasian female referred by Horald Pollen, M.D.,to characterize hercurrent cognitive functioning and assist with diagnostic clarity and treatment planning in the context of subjective cognitive decline and a history of hyperparathyroidism s/p partial thyroidectomy, ADHD, and several psychiatric comorbidities.  Feedback:   Ms. Harju completed a comprehensive neuropsychological evaluation on 02/26/2019. Briefly, results suggested neuropsychological functioning within normal limits. A relative weakness was exhibited across a single task assessing phonemic fluency; however, scores across other expressive language tasks were within normal limits. Responses across self-report questionnaires suggested that recent symptoms of depression were in the severe range, while recent symptoms of anxiety were in the moderate range. As such, she continues to meet criteria for Major Depressive Disorder, moderate to severe, as well as Generalized Anxiety Disorder. Significant and non-optimally managed psychiatric illness can directly influence cognitive efficiency, cause day-to-day cognitive difficulties, and, along with ADHD symptoms, is likely the primary cause of difficulties which Ms. Kauble has been experiencing lately. Recommendations included medication and/or psychotherapy to treat mood-related symptoms.  Ms. Mccrummen was unaccompanied. Content of the current session focused on the results of cognitive testing and the influence mood-related concerns can have on cognitive abilities. Ms. Fragoso was given the opportunity to ask questions and all her questions were answered. she was also encouraged to reach out should additional questions arise.     A total of 35 minutes were spent with Ms. Nhem during the current feedback session.

## 2019-03-06 DIAGNOSIS — J301 Allergic rhinitis due to pollen: Secondary | ICD-10-CM | POA: Diagnosis not present

## 2019-03-06 DIAGNOSIS — M25551 Pain in right hip: Secondary | ICD-10-CM | POA: Diagnosis not present

## 2019-03-06 DIAGNOSIS — J3081 Allergic rhinitis due to animal (cat) (dog) hair and dander: Secondary | ICD-10-CM | POA: Diagnosis not present

## 2019-03-06 DIAGNOSIS — M1611 Unilateral primary osteoarthritis, right hip: Secondary | ICD-10-CM | POA: Diagnosis not present

## 2019-03-06 DIAGNOSIS — J3089 Other allergic rhinitis: Secondary | ICD-10-CM | POA: Diagnosis not present

## 2019-03-12 DIAGNOSIS — Z2089 Contact with and (suspected) exposure to other communicable diseases: Secondary | ICD-10-CM | POA: Diagnosis not present

## 2019-03-12 DIAGNOSIS — Z23 Encounter for immunization: Secondary | ICD-10-CM | POA: Diagnosis not present

## 2019-03-12 DIAGNOSIS — G8929 Other chronic pain: Secondary | ICD-10-CM | POA: Diagnosis not present

## 2019-03-13 DIAGNOSIS — J3081 Allergic rhinitis due to animal (cat) (dog) hair and dander: Secondary | ICD-10-CM | POA: Diagnosis not present

## 2019-03-13 DIAGNOSIS — J301 Allergic rhinitis due to pollen: Secondary | ICD-10-CM | POA: Diagnosis not present

## 2019-03-13 DIAGNOSIS — J3089 Other allergic rhinitis: Secondary | ICD-10-CM | POA: Diagnosis not present

## 2019-03-18 ENCOUNTER — Ambulatory Visit: Payer: BC Managed Care – PPO | Admitting: Orthopedic Surgery

## 2019-03-23 DIAGNOSIS — Z23 Encounter for immunization: Secondary | ICD-10-CM | POA: Diagnosis not present

## 2019-03-23 DIAGNOSIS — Z2089 Contact with and (suspected) exposure to other communicable diseases: Secondary | ICD-10-CM | POA: Diagnosis not present

## 2019-03-23 DIAGNOSIS — R0602 Shortness of breath: Secondary | ICD-10-CM | POA: Diagnosis not present

## 2019-03-23 DIAGNOSIS — R509 Fever, unspecified: Secondary | ICD-10-CM | POA: Diagnosis not present

## 2019-03-23 DIAGNOSIS — R05 Cough: Secondary | ICD-10-CM | POA: Diagnosis not present

## 2019-03-27 DIAGNOSIS — F331 Major depressive disorder, recurrent, moderate: Secondary | ICD-10-CM | POA: Diagnosis not present

## 2019-03-27 DIAGNOSIS — F341 Dysthymic disorder: Secondary | ICD-10-CM | POA: Diagnosis not present

## 2019-03-30 DIAGNOSIS — J209 Acute bronchitis, unspecified: Secondary | ICD-10-CM | POA: Diagnosis not present

## 2019-04-01 ENCOUNTER — Ambulatory Visit: Payer: BC Managed Care – PPO | Admitting: Orthopedic Surgery

## 2019-04-06 DIAGNOSIS — J309 Allergic rhinitis, unspecified: Secondary | ICD-10-CM | POA: Diagnosis not present

## 2019-04-06 DIAGNOSIS — R509 Fever, unspecified: Secondary | ICD-10-CM | POA: Diagnosis not present

## 2019-04-06 DIAGNOSIS — U071 COVID-19: Secondary | ICD-10-CM | POA: Diagnosis not present

## 2019-04-06 DIAGNOSIS — R5381 Other malaise: Secondary | ICD-10-CM | POA: Diagnosis not present

## 2019-04-07 ENCOUNTER — Other Ambulatory Visit: Payer: Self-pay

## 2019-04-07 ENCOUNTER — Emergency Department (HOSPITAL_COMMUNITY)
Admission: EM | Admit: 2019-04-07 | Discharge: 2019-04-07 | Discharge status: Against Medical Advice | Payer: BC Managed Care – PPO

## 2019-04-08 DIAGNOSIS — R509 Fever, unspecified: Secondary | ICD-10-CM | POA: Diagnosis not present

## 2019-04-08 DIAGNOSIS — R5381 Other malaise: Secondary | ICD-10-CM | POA: Diagnosis not present

## 2019-04-08 DIAGNOSIS — J309 Allergic rhinitis, unspecified: Secondary | ICD-10-CM | POA: Diagnosis not present

## 2019-04-08 DIAGNOSIS — U071 COVID-19: Secondary | ICD-10-CM | POA: Diagnosis not present

## 2019-04-09 ENCOUNTER — Ambulatory Visit: Payer: BC Managed Care – PPO | Admitting: Sports Medicine

## 2019-04-10 DIAGNOSIS — J309 Allergic rhinitis, unspecified: Secondary | ICD-10-CM | POA: Diagnosis not present

## 2019-04-13 DIAGNOSIS — A6920 Lyme disease, unspecified: Secondary | ICD-10-CM | POA: Diagnosis not present

## 2019-04-13 DIAGNOSIS — J309 Allergic rhinitis, unspecified: Secondary | ICD-10-CM | POA: Diagnosis not present

## 2019-04-15 DIAGNOSIS — A6920 Lyme disease, unspecified: Secondary | ICD-10-CM | POA: Diagnosis not present

## 2019-04-15 DIAGNOSIS — J309 Allergic rhinitis, unspecified: Secondary | ICD-10-CM | POA: Diagnosis not present

## 2019-04-23 ENCOUNTER — Ambulatory Visit: Payer: BC Managed Care – PPO | Admitting: Sports Medicine

## 2019-04-23 ENCOUNTER — Other Ambulatory Visit: Payer: Self-pay

## 2019-04-23 VITALS — BP 152/90 | Ht 64.5 in | Wt 157.0 lb

## 2019-04-23 DIAGNOSIS — G8929 Other chronic pain: Secondary | ICD-10-CM | POA: Diagnosis not present

## 2019-04-23 DIAGNOSIS — M25551 Pain in right hip: Secondary | ICD-10-CM

## 2019-04-23 NOTE — Progress Notes (Signed)
  [name] [name] - 63 y.o. female MRN [id number]  Date of birth: 05-20-1956  SUBJECTIVE:   CC: chronic right hip pain    63 yo female with h/o lumbar hemi-laminectomy in 2009 who is presenting with 5-10 years of right hip pain. She has been seeing Dr. Melvyn Novas and Dr. Maureen Ralphs at [facility] for her right hip. Recent x-rays showed bone cartilage loss with subchondral cystic formation and osteophyte formation. She has been told that she needs a hip replacement. She is presenting for second opinion and would like to avoid surgery.  She primarily has pain over lateral hip. Pain worse at night as she cannot sleep on her right side. She is sleeping on the cough with many pillows to get comfortable. No pain in groin. People have told her that she is limping more. She has been taking ibuprofen since recent Lyme disease diagnosis (several weeks ago) and this has helped hip pain significantly.  ROS: No unexpected weight loss, fever, chills, swelling, instability, muscle pain, numbness/tingling, redness, otherwise see HPI   PMHx - Updated and reviewed.  Hyperparathyroid syndrome. Lyme disease recently diagnosed, currently on doxycycline.  PSHx - Updated and reviewed.  Contributory factors include:  Negative FHx - Updated and reviewed.  Contributory factors include:  Negative Social Hx - Updated and reviewed. Contributory factors include: Negative Medications - reviewed   DATA REVIEWED: none  PHYSICAL EXAM:  VS: BP:(!) 152/90  HR: bpm  TEMP: ( )  RESP:   HT:5' 4.5" (163.8 cm)   WT:157 lb (71.2 kg)  BMI:26.54 PHYSICAL EXAM: Gen: NAD, alert, cooperative with exam, well-appearing HEENT: clear conjunctiva,  CV:  no edema, capillary refill brisk, normal rate Resp: non-labored Skin: no rashes, normal turgor  Neuro: no gross deficits.  Psych:  alert and oriented  Right Hip:  - Inspection: No gross deformity, no swelling, erythema, or ecchymosis - Palpation: TTP over greater trochanter - ROM:  Normal range of motion on Flexion, extension, abduction, internal and external rotation - Strength: Normal strength. - Neuro/vasc: NV intact distally - Special Tests: Negative FABER and FADIR.  Positive Trendelenburg on right side.  Left hip: No TTP Full ROM Normal strength NVI  Trendelenburg gait   ASSESSMENT & PLAN:   Chronic right hip pain Although there is severe osteoarthritis in right hip, her pain appears to be coming from gluteus medius syndrome from altered gait.   Lateral heel wedge provided on right; this slightly improved Trendelenburg gait Hip abduction exercises reviewed Return PRN  I observed and examined the patient with the Dr. Mayer Masker and agree with assessment and plan.  Note reviewed and modified by me.  I discussed with the patient that the hip will need THR at some point.  However, if she is not ready and gets pain relief with hip exercises, heel wedge and NSAIDs she can wait until she is ready.  If her ADLs are limited I would suggest moving ahead with surgery. [name] Mcgill, MD

## 2019-04-23 NOTE — Patient Instructions (Signed)
Exercises to help with hip abduction strength:  Leg raises while lying on your left side   Hip abduction exercises: start with 3 sets of 5 repetitions of each exercises. Work your way up to 3 sets of 15 repetitions Lie on side and lift leg up Standing hip rotation   Take ibuprofen  Wear wedge in right shoe

## 2019-04-23 NOTE — Assessment & Plan Note (Signed)
Although there is severe osteoarthritis in right hip, her pain appears to be coming from gluteus medius syndrome from altered gait.   Lateral heel wedge provided on right; this slightly improved Trendelenburg gait Hip abduction exercises reviewed Return PRN

## 2019-05-04 ENCOUNTER — Encounter (HOSPITAL_COMMUNITY): Payer: Self-pay | Admitting: Emergency Medicine

## 2019-05-04 ENCOUNTER — Emergency Department (HOSPITAL_COMMUNITY)
Admission: EM | Admit: 2019-05-04 | Discharge: 2019-05-04 | Disposition: A | Discharge status: Home / Self Care | Payer: BC Managed Care – PPO | Attending: Emergency Medicine | Admitting: Emergency Medicine

## 2019-05-04 ENCOUNTER — Emergency Department (HOSPITAL_COMMUNITY): Payer: BC Managed Care – PPO

## 2019-05-04 ENCOUNTER — Other Ambulatory Visit: Payer: Self-pay

## 2019-05-04 DIAGNOSIS — M542 Cervicalgia: Secondary | ICD-10-CM | POA: Diagnosis not present

## 2019-05-04 DIAGNOSIS — F17210 Nicotine dependence, cigarettes, uncomplicated: Secondary | ICD-10-CM | POA: Insufficient documentation

## 2019-05-04 DIAGNOSIS — Z79899 Other long term (current) drug therapy: Secondary | ICD-10-CM | POA: Diagnosis not present

## 2019-05-04 DIAGNOSIS — M25511 Pain in right shoulder: Secondary | ICD-10-CM

## 2019-05-04 HISTORY — DX: Lyme disease, unspecified: A69.20

## 2019-05-04 MED ORDER — HYDROCODONE-ACETAMINOPHEN 5-325 MG PO TABS
1.0000 | ORAL_TABLET | Freq: Once | ORAL | Status: AC
  Administered 2019-05-04: 1 via ORAL
  Filled 2019-05-04: qty 1

## 2019-05-04 MED ORDER — OXYCODONE HCL 5 MG PO TABS: 5.0000 mg | ORAL_TABLET | ORAL | 0 refills | Status: DC | PRN

## 2019-05-04 MED ORDER — MELOXICAM 15 MG PO TABS: 15.0000 mg | ORAL_TABLET | Freq: Every day | ORAL | 0 refills | Status: AC

## 2019-05-04 MED ORDER — TRAMADOL HCL 50 MG PO TABS
50.0000 mg | ORAL_TABLET | Freq: Once | ORAL | Status: DC
Start: 2019-05-04 — End: 2019-05-04

## 2019-05-04 NOTE — ED Provider Notes (Signed)
[facility] DEPT Provider Note   CSN: [id number] Arrival date & time: 05/04/19  1504     History   Chief Complaint Chief Complaint  Patient presents with   Neck Pain   Shoulder Pain   Arm Pain    HPI [name] [name] is a 63 y.o. female with history of ADHD, generalized anxiety disorder, hyperparathyroidism, major depressive disorder, osteopenia, vitamin D deficiency, Lyme disease presents for evaluation of acute onset, progressively worsening right shoulder pain for 4 days.  No known trauma.  Reports pain is sharp and severe especially with any abduction or flexion of the shoulder.  Denies numbness or weakness of the extremity.  The pain will radiate down into the upper arm and around the right side of the neck.  Denies fevers.  Has been taking Tylenol with little relief.  Has had to stop taking ibuprofen due to upset stomach issues after taking a large amount over the last few months due to low-grade fever secondary to Lyme disease.  She is completing a course of doxycycline for this prescribed by her PCP.  He has an appointment to see Dr. Aline Brochure with orthopedics in 2 days for a second opinion regarding possible hip surgery.      The history is provided by the patient.    Past Medical History:  Diagnosis Date   ADHD (attention deficit hyperactivity disorder), inattentive type    Anemia    with associated shortness of breath   Generalized anxiety disorder    Hypercalcemia    Hyperparathyroidism ([place])    Lyme disease    Major depressive disorder    Osteopenia    Seasonal allergies    URI (upper respiratory infection)    current cough and head congestion- no fever   Vitamin D deficiency     Patient Active Problem List   Diagnosis Date Noted   Chronic right hip pain 04/23/2019   Generalized anxiety disorder    Major depressive disorder    ADHD (attention deficit hyperactivity disorder), inattentive type 2020    Past  Surgical History:  Procedure Laterality Date   back cysts removed     multiple  removed x 4   BACK SURGERY  2009   Hemi laminectomy   DIAGNOSTIC LAPAROSCOPY     DILATION AND CURETTAGE OF UTERUS  2013   hysteroscopy resect polyp   PARATHYROIDECTOMY     thyroidectomy (partial)   WISDOM TOOTH EXTRACTION       OB History   No obstetric history on file.      Home Medications    Prior to Admission medications   Medication Sig Start Date End Date Taking? Authorizing Provider  amphetamine-dextroamphetamine (ADDERALL) 20 MG tablet Take 20 mg by mouth 3 (three) times daily as needed. Pt takes up to three times a day for ADD    [provider]  buPROPion (WELLBUTRIN SR) 150 MG 12 hr tablet Take 150 mg by mouth daily.     [provider]  Calcium Carbonate-Vitamin D (CALCIUM HIGH POTENCY/VITAMIN D) 600-200 MG-UNIT TABS Take by mouth daily.    [provider]  doxycycline (VIBRAMYCIN) 100 MG capsule Take 100 mg by mouth 2 (two) times daily. 04/15/19   [provider]  fexofenadine (ALLEGRA) 180 MG tablet Take 180 mg by mouth daily.    [provider]  levothyroxine (SYNTHROID, LEVOTHROID) 100 MCG tablet Take 100 mcg by mouth daily before breakfast.    [provider]  meloxicam (MOBIC) 15  MG tablet Take 1 tablet (15 mg total) by mouth daily. 05/04/19   Averey Koning A, PA-C  montelukast (SINGULAIR) 10 MG tablet 10 mg daily as needed.  04/27/16   [provider]  Multiple Vitamin (MULTIVITAMIN WITH MINERALS) TABS Take 1 tablet by mouth daily.    [provider]  oxyCODONE (ROXICODONE) 5 MG immediate release tablet Take 1 tablet (5 mg total) by mouth every 4 (four) hours as needed for severe pain. 05/04/19   Arel Tippen A, PA-C  traZODone (DESYREL) 50 MG tablet 50 mg at bedtime. 04/29/16   [provider]  TRINTELLIX 5 MG TABS tablet Take 5 mg by mouth daily. 04/21/19   [provider]  UNABLE TO FIND Med  Name: Allergy shots by Dr. Jeni Salles    [provider]    Family History Family History  Problem Relation Age of Onset   Cancer Father    Cancer Sister    Cancer Brother    Cancer Paternal Uncle     Social History Social History   Tobacco Use   Smoking status: Current Some Day Smoker    Packs/day: 0.35    Years: 15.00    Pack years: 5.25    Types: Cigarettes   Smokeless tobacco: Never Used   Tobacco comment: Smokes approximately 1 pack of cigerattes per week  Substance Use Topics   Alcohol use: Yes    Alcohol/week: 1.0 standard drinks    Types: 1 Cans of beer per week    Comment: Daily, alcohol content <1%   Drug use: No    Comment: quit 2016     Allergies   Other, Sulfa antibiotics, and Augmentin [amoxicillin-pot clavulanate]   Review of Systems Review of Systems  Constitutional: Negative for chills and fever.  Respiratory: Negative for shortness of breath.   Cardiovascular: Negative for chest pain.  Musculoskeletal: Positive for arthralgias. Negative for neck pain.  Neurological: Negative for weakness and numbness.  All other systems reviewed and are negative.    Physical Exam Updated Vital Signs BP 133/82 (BP Location: Left Arm)    Pulse 99    Temp 98.7 F (37.1 C) (Oral)    Resp 16    Ht 5' 4.5" (1.638 m)    Wt 71.2 kg    SpO2 96%    BMI 26.53 kg/m   Physical Exam Vitals signs and nursing note reviewed.  Constitutional:      General: She is not in acute distress.    Appearance: She is well-developed.  HENT:     Head: Normocephalic and atraumatic.  Eyes:     General:        Right eye: No discharge.        Left eye: No discharge.     Conjunctiva/sclera: Conjunctivae normal.  Neck:     Vascular: No JVD.     Trachea: No tracheal deviation.  Cardiovascular:     Rate and Rhythm: Normal rate.     Pulses: Normal pulses.     Comments: 2+ radial pulses bilaterally Pulmonary:     Effort: Pulmonary effort is normal.  Abdominal:      General: There is no distension.  Musculoskeletal:        General: Tenderness present. No swelling, deformity or signs of injury.     Comments: Tenderness to palpation of the right shoulder overlying the right acromioclavicular joint and bicipital tendon groove.  Positive arc of pain.  Unable to assess Hawkins/Neer's impingement test due to severe pain  with abduction and flexion.  Limited range of motion of the right upper extremity at the shoulder due to pain.  5/5 strength of BUE major muscle groups.  Equal grip strength bilaterally.  No erythema or warmth to the right shoulder joint.  Skin:    General: Skin is warm and dry.     Capillary Refill: Capillary refill takes less than 2 seconds.     Findings: No erythema.  Neurological:     Mental Status: She is alert.     Comments: Fluent speech, no facial droop, sensation intact to light touch of bilateral upper extremities.  Psychiatric:        Behavior: Behavior normal.      ED Treatments / Results  Labs (all labs ordered are listed, but only abnormal results are displayed) Labs Reviewed - No data to display  EKG None  Radiology Dg Shoulder Right  Result Date: 05/04/2019 CLINICAL DATA:  Right shoulder pain for 3 days.  No known injury. EXAM: RIGHT SHOULDER - 2+ VIEW COMPARISON:  None. FINDINGS: No acute fracture dislocation. Glenohumeral and acromioclavicular joints are within normal limits. Ovoid mineralized densities adjacent to the greater trochanter compatible with hydroxyapatite deposition/calcific tendinopathy. IMPRESSION: 1. No acute osseous abnormality, right shoulder. 2. Findings of rotator cuff calcific tendinopathy. Electronically Signed   By: Davina Poke M.D.   On: 05/04/2019 16:26    Procedures Procedures (including critical care time)  Medications Ordered in ED Medications  HYDROcodone-acetaminophen (NORCO/VICODIN) 5-325 MG per tablet 1 tablet (1 tablet Oral Given 05/04/19 1609)     Initial Impression /  Assessment and Plan / ED Course  I have reviewed the triage vital signs and the nursing notes.  Pertinent labs & imaging results that were available during my care of the patient were reviewed by me and considered in my medical decision making (see chart for details).        Patient presenting for evaluation of atraumatic right shoulder pain.  She is afebrile, vital signs are stable.  She is nontoxic in appearance.  She is neurovascularly intact.  Physical examination is limited secondary to severe pain with active and passive range of motion of the right shoulder joint however no erythema or warmth noted.  No constitutional symptoms and I doubt septic arthritis.  Doubt gout or DVT.  No chest pain, doubt ACS/MI and her pain is on the right.  Her history and physical examination is suggestive of rotator cuff pathology.  Also considered Lyme arthropathy.  Radiographs show no evidence of acute osseous abnormality but she does have calcific tendinopathy of the rotator cuff on imaging today.  Pain managed in the ED.  She is resting comfortably on reassessment.  Discussed conservative therapy management.  Will discharge with course of meloxicam to take as it is less irritating than other NSAIDs.  Recommend she take this with Pepcid or omeprazole.  She has an appointment to see an orthopedist in 2 days to get a second opinion on her chronic hip pain but Dr. Wynelle Link is her primary orthopedist.  We will give shoulder sling to wear for comfort, advised that she should do some gentle stretching exercises throughout the day to avoid further stiffness.  We will give small amount of Roxicodone to take as needed for severe breakthrough pain.  [place] controlled substance registry was queried with no inconsistencies.  Discussed strict ED return precautions.  Patient and husband verbalized understanding of and agreement with plan and patient stable for discharge home at this time.  Final Clinical Impressions(s) /  ED Diagnoses   Final diagnoses:  Acute pain of right shoulder    ED Discharge Orders         Ordered    meloxicam (MOBIC) 15 MG tablet  Daily     05/04/19 1737    oxyCODONE (ROXICODONE) 5 MG immediate release tablet  Every 4 hours PRN     05/04/19 1737           Renita Papa, PA-C 05/04/19 1747    Blanchie Dessert, MD 05/04/19 2205

## 2019-05-04 NOTE — ED Notes (Signed)
Patient has used tylenol and heat to help with the shoulder pain.

## 2019-05-04 NOTE — Discharge Instructions (Addendum)
1. Medications: Start taking Mobic once daily with food for pain.  You can take this with an antacid medication such as Pepcid or omeprazole as well.  Do not take ibuprofen, Advil, Aleve, or Motrin while taking this medication.  Call your gastroenterologist for recommendations regarding how much Tylenol is safe for you to take.  Take oxycodone as needed for severe pain but do not drive, drink alcohol, or operate heavy machinery while taking this medication as it can make you drowsy.  I usually recommend only taking this medicine at night to help you sleep.  You can also cut these tablets in half.  2. Treatment: rest, apply ice or heat whichever feels best, drink plenty of fluids, wear the shoulder sling for comfort only, make sure to do some gentle stretching (see attached or you can YouTube search rotator cuff physical therapy exercises) 3. Follow Up: Please followup with orthopedics as directed or your PCP in 1 week if no improvement for discussion of your diagnoses and further evaluation after today's visit; if you do not have a primary care doctor use the resource guide provided to find one; Please return to the ER for worsening symptoms or other concerns such as worsening swelling, redness of the skin, fevers, loss of pulses, or loss of feeling

## 2019-05-04 NOTE — ED Triage Notes (Signed)
Patient presents with pain in the right shoulder pain with radiates to the neck and rest of the arm. Patient thinks it may be a pulled muscle. She noticed the pain first on Friday.

## 2019-05-06 ENCOUNTER — Other Ambulatory Visit: Payer: Self-pay

## 2019-05-06 ENCOUNTER — Encounter: Payer: Self-pay | Admitting: Orthopedic Surgery

## 2019-05-06 ENCOUNTER — Ambulatory Visit (INDEPENDENT_AMBULATORY_CARE_PROVIDER_SITE_OTHER): Payer: BC Managed Care – PPO | Admitting: Orthopedic Surgery

## 2019-05-06 VITALS — BP 150/120 | HR 82 | Ht 64.5 in | Wt 156.0 lb

## 2019-05-06 DIAGNOSIS — M1611 Unilateral primary osteoarthritis, right hip: Secondary | ICD-10-CM

## 2019-05-06 DIAGNOSIS — M7531 Calcific tendinitis of right shoulder: Secondary | ICD-10-CM

## 2019-05-06 NOTE — Progress Notes (Signed)
[name] [name]  05/10/2019  HISTORY SECTION :  Chief Complaint  Patient presents with  . Hip Pain    right has been told x 2 needs hip replacement    63 year old female comes as a second opinion request for right hip pain  She has had pain in her right hip on and off for 5 years but worsening.  She has had some anti-inflammatory medications that she took for a fever related to Lyme disease and that seemed to help her symptoms.  Her pain is actually located over the abductor musculature of the right hip she does not have groin or thigh pain she does have a noticeable limp  She has not had any other physical therapy or treatment the pain does seem to be getting worse  She also noticed acute onset of pain in the right shoulder last week went to emergency room and was diagnosed with calcific tendinitis started on meloxicam placed in a sling she also has discomfort in the right shoulder   Review of Systems  Constitutional: Positive for fever and malaise/fatigue. Negative for chills and weight loss.  HENT: Positive for congestion.   Musculoskeletal: Positive for joint pain.  All other systems reviewed and are negative.    has a past medical history of ADHD (attention deficit hyperactivity disorder), inattentive type, Anemia, Generalized anxiety disorder, Hypercalcemia, Hyperparathyroidism ([place]), Lyme disease, Major depressive disorder, Osteopenia, Seasonal allergies, URI (upper respiratory infection), and Vitamin D deficiency.   Past Surgical History:  Procedure Laterality Date  . back cysts removed     multiple  removed x 4  . BACK SURGERY  2009   Hemi laminectomy  . DIAGNOSTIC LAPAROSCOPY    . DILATION AND CURETTAGE OF UTERUS  2013   hysteroscopy resect polyp  . PARATHYROIDECTOMY     thyroidectomy (partial)  . WISDOM TOOTH EXTRACTION      Body mass index is 26.36 kg/m.   Allergies  Allergen Reactions  . Other Cough    Grass/pollen  . Sulfa Antibiotics Other (See  Comments)    Flu-like symptoms  . Augmentin [Amoxicillin-Pot Clavulanate] Rash     Current Outpatient Medications:  .  amphetamine-dextroamphetamine (ADDERALL) 20 MG tablet, Take 20 mg by mouth 3 (three) times daily as needed. Pt takes up to three times a day for ADD, Disp: , Rfl:  .  buPROPion (WELLBUTRIN SR) 150 MG 12 hr tablet, Take 150 mg by mouth daily. , Disp: , Rfl:  .  Calcium Carbonate-Vitamin D (CALCIUM HIGH POTENCY/VITAMIN D) 600-200 MG-UNIT TABS, Take by mouth daily., Disp: , Rfl:  .  doxycycline (VIBRAMYCIN) 100 MG capsule, Take 100 mg by mouth 2 (two) times daily., Disp: , Rfl:  .  fexofenadine (ALLEGRA) 180 MG tablet, Take 180 mg by mouth daily., Disp: , Rfl:  .  levothyroxine (SYNTHROID, LEVOTHROID) 100 MCG tablet, Take 100 mcg by mouth daily before breakfast., Disp: , Rfl:  .  meloxicam (MOBIC) 15 MG tablet, Take 1 tablet (15 mg total) by mouth daily., Disp: 30 tablet, Rfl: 0 .  montelukast (SINGULAIR) 10 MG tablet, 10 mg daily as needed. , Disp: , Rfl:  .  Multiple Vitamin (MULTIVITAMIN WITH MINERALS) TABS, Take 1 tablet by mouth daily., Disp: , Rfl:  .  TRINTELLIX 5 MG TABS tablet, Take 5 mg by mouth daily., Disp: , Rfl:  .  oxyCODONE (ROXICODONE) 5 MG immediate release tablet, Take 1 tablet (5 mg total) by mouth every 4 (four) hours as needed for severe pain. (Patient  not taking: Reported on 05/06/2019), Disp: 10 tablet, Rfl: 0 .  traZODone (DESYREL) 50 MG tablet, 50 mg at bedtime., Disp: , Rfl:  .  UNABLE TO FIND, Med Name: Allergy shots by Dr. Jeni Salles, Disp: , Rfl:    PHYSICAL EXAM SECTION: 1) BP (!) 150/120   Pulse 82   Ht 5' 4.5" (1.638 m)   Wt 156 lb (70.8 kg)   BMI 26.36 kg/m   Body mass index is 26.36 kg/m. General appearance: Well-developed well-nourished no gross deformities  2) Cardiovascular normal pulse and perfusion in the lower  extremities normal color without edema  3) Neurologically deep tendon reflexes are equal and normal, no sensation loss or  deficits no pathologic reflexes  4) Psychological: Awake alert and oriented x3 mood and affect normal  5) Skin no lacerations or ulcerations no nodularity no palpable masses, no erythema or nodularity  6) Musculoskeletal:  Right shoulder skin is normal shoulder is in a sling  Right hip normal flexion 125 degrees normal internal rotation external rotation abduction abduction and this is duplicated on the left hip.  She has a negative right Stinchfield test.  She has no back pain but mild tenderness to palpation just to the right of the midline this is not the pain she is experiencing however as that is located on the right greater trochanteric region just above the hip in the soft tissues and abductors  MEDICAL DECISION SECTION:  Encounter Diagnoses  Name Primary?  . Calcific tendinitis of right shoulder   . Primary osteoarthritis of right hip Yes    Imaging She brought a disc with her and it has the images of her hip she definitely has osteoarthritis of the right hip with joint space narrowing cyst formation and small osteophytes no arthritis seen on the left hip  Plan:  (Rx., Inj., surg., Frx, MRI/CT, XR:2)  Recommend meloxicam and sling for several days up to 5 for the calcific tendinitis  As far as the right hip goes recommend elective surgery    3:48 PM Arther Abbott, MD  05/10/2019

## 2019-05-14 DIAGNOSIS — F3342 Major depressive disorder, recurrent, in full remission: Secondary | ICD-10-CM | POA: Diagnosis not present

## 2019-05-18 DIAGNOSIS — F4323 Adjustment disorder with mixed anxiety and depressed mood: Secondary | ICD-10-CM | POA: Diagnosis not present

## 2019-05-25 DIAGNOSIS — F4323 Adjustment disorder with mixed anxiety and depressed mood: Secondary | ICD-10-CM | POA: Diagnosis not present

## 2019-06-08 DIAGNOSIS — F4323 Adjustment disorder with mixed anxiety and depressed mood: Secondary | ICD-10-CM | POA: Diagnosis not present

## 2019-06-22 DIAGNOSIS — F4323 Adjustment disorder with mixed anxiety and depressed mood: Secondary | ICD-10-CM | POA: Diagnosis not present

## 2019-07-07 ENCOUNTER — Encounter: Payer: Self-pay | Admitting: Sports Medicine

## 2019-07-07 ENCOUNTER — Other Ambulatory Visit: Payer: Self-pay

## 2019-07-07 ENCOUNTER — Ambulatory Visit: Payer: BC Managed Care – PPO | Admitting: Sports Medicine

## 2019-07-07 VITALS — BP 118/66 | Ht 64.0 in | Wt 160.0 lb

## 2019-07-07 DIAGNOSIS — G8929 Other chronic pain: Secondary | ICD-10-CM | POA: Diagnosis not present

## 2019-07-07 DIAGNOSIS — M25551 Pain in right hip: Secondary | ICD-10-CM

## 2019-07-07 NOTE — Progress Notes (Signed)
   [facility] [address] [place], [place] [address] Phone: [phone] Fax: [phone]   Patient Name: [name] [name] Date of Birth: July 23, 1955 Medical Record Number: [id number] Gender: female Date of Encounter: 07/07/2019  SUBJECTIVE:      Chief Complaint:  Right hip pain   HPI:  Patient is 64 year old female presenting for follow-up of right hip pain.  She admits she was not very good about doing her home exercise program.  She is having pain with walking as little as one quarter of a mile.  Aggravating factors include walking upstairs.  Has been told she has bone-on-bone end-stage hip osteoarthritis and is contemplating surgical intervention for a THA.  There is concern that the surgery may not fix the lateral hip pain she is experiencing.  She has had success with trochanteric bursa injections in the past.   Past Consults:  Dr Maureen Ralphs evaluated and suggested a candidate for [facility] She did try PT for a year off and on several years ago but still had pain  Dr. Aline Brochure - choose hip surgery electively/ OA     ROS:     See HPI.   PERTINENT  PMH / PSH / FH / SH:  Past Medical, Surgical, Social, and Family History Reviewed & Updated in the EMR. Pertinent findings include:  End-stage right hip osteoarthritis, hyperparathyroidism, osteopenia, Lyme disease   OBJECTIVE:  BP 118/66   Ht 5\' 4"  (1.626 m)   Wt 160 lb (72.6 kg)   BMI 27.46 kg/m  Physical Exam:  Vital signs are reviewed.   GEN: Alert and oriented, NAD Pulm: Breathing unlabored PSY: normal mood, congruent affect  MSK: Right hip: ROM Right internal rotation is about 15 degrees less than left hip internal rotation Strength IR: 5/5, ER: 5/5, Flexion: 5/5, Extension: 5/5, Abduction: 5/5, Adduction: 5/5 Pelvic alignment unremarkable to inspection and palpation. Standing hip rotation and gait with positive trendelenburg sign and unsteadiness. Greater trochanter without tenderness  to palpation. No tenderness over piriformis. No pain with FABER or FADIR. No SI joint tenderness and normal minimal SI movement.   ASSESSMENT & PLAN:   1. Right end-stage hip osteoarthritis  Given the severity of her Trendelenburg sign when ambulating, it is our recommendation to proceed further with surgical intervention for a right THA.  She can continue with her home exercises as that will only help the rehab after surgery.  Regards to scheduling the surgery, we left it up to patient as it is a nonurgent matter.   Lanier Clam, DO, ATC Sports Medicine Fellow  I observed and examined the patient with Dr. Kathrynn Speed and agree with assessment and plan.  Note reviewed and modified by me.  I spent 28 minutes with this patient. Over 50% of visit was spend in counseling and coordination of care for problems with Hip OA.  [name], MD

## 2019-09-24 ENCOUNTER — Ambulatory Visit: Payer: Self-pay | Attending: Internal Medicine

## 2019-09-24 DIAGNOSIS — Z23 Encounter for immunization: Secondary | ICD-10-CM

## 2019-09-24 NOTE — Progress Notes (Signed)
   Covid-19 Vaccination Clinic  Name:  [name] [name]    MRN: [id number] DOB: [birth year]  09/24/2019  [name] was observed post Covid-19 immunization for 15 minutes without incident. She was provided with Vaccine Information Sheet and instruction to access the V-Safe system.   [name] was instructed to call 911 with any severe reactions post vaccine: Marland Kitchen Difficulty breathing  . Swelling of face and throat  . A fast heartbeat  . A bad rash all over body  . Dizziness and weakness   Immunizations Administered    Name Date Dose VIS Date Route   Pfizer COVID-19 Vaccine 09/24/2019  3:18 PM 0.3 mL 06/12/2019 Intramuscular   Manufacturer: [place]   Lot: [id number]   [place]: [id number]

## 2019-10-19 ENCOUNTER — Ambulatory Visit: Payer: BC Managed Care – PPO | Attending: Internal Medicine

## 2019-10-19 DIAGNOSIS — Z23 Encounter for immunization: Secondary | ICD-10-CM

## 2019-10-19 NOTE — Progress Notes (Signed)
   Covid-19 Vaccination Clinic  Name:  [name] [name]    MRN: [id number] DOB: [birth year]  10/19/2019  [name] was observed post Covid-19 immunization for 15 minutes without incident. She was provided with Vaccine Information Sheet and instruction to access the V-Safe system.   [name] was instructed to call 911 with any severe reactions post vaccine: Marland Kitchen Difficulty breathing  . Swelling of face and throat  . A fast heartbeat  . A bad rash all over body  . Dizziness and weakness   Immunizations Administered    Name Date Dose VIS Date Route   Pfizer COVID-19 Vaccine 10/19/2019  1:00 PM 0.3 mL 08/26/2018 Intramuscular   Manufacturer: [place]   Lot: [id number]   [place]: [id number]

## 2019-10-22 ENCOUNTER — Encounter: Payer: Self-pay | Admitting: Physician Assistant

## 2019-10-22 ENCOUNTER — Ambulatory Visit: Payer: BC Managed Care – PPO | Admitting: Physician Assistant

## 2019-10-22 ENCOUNTER — Other Ambulatory Visit: Payer: Self-pay

## 2019-10-22 DIAGNOSIS — Z1283 Encounter for screening for malignant neoplasm of skin: Secondary | ICD-10-CM

## 2019-10-22 DIAGNOSIS — L820 Inflamed seborrheic keratosis: Secondary | ICD-10-CM

## 2019-10-22 NOTE — Progress Notes (Signed)
   Follow-Up Visit   Subjective  [name] [name] is a 64 y.o. female who presents for the following: Annual Exam (no new concerns ). Hair loss from last visit is working( minoxidil 5%) Brown crusts- all over - some itch    The following portions of the chart were reviewed this encounter and updated as appropriate: Tobacco  Allergies  Meds  Problems  Med Hx  Surg Hx  Fam Hx  Soc Hx      Objective  Well appearing patient in no apparent distress; mood and affect are within normal limits.  A full examination was performed including scalp, head, eyes, ears, nose, lips, neck, chest, axillae, abdomen, back, buttocks, bilateral upper extremities, bilateral lower extremities, hands, feet, fingers, toes, fingernails, and toenails. All findings within normal limits unless otherwise noted below.  Objective  Head to Toe, Left Hand - Posterior, Right Hand - Posterior: No atypical nevi noted/ no signs of NMSC  Objective  Head - Anterior (Face), Left Breast, Left Forearm - Posterior (3), Left Lower Leg - Anterior, Left Upper Arm - Posterior, Right Breast (2), Right Forearm - Posterior, Right Lower Leg - Anterior (2), Right Upper Arm - Posterior, Scalp: Erythematous stuck-on, waxy papule or plaque.    Assessment & Plan  Screening exam for skin cancer (3) Left Hand - Posterior; Right Hand - Posterior; Head to Toe  observe  Seborrheic keratoses, inflamed (14) Head - Anterior (Face); Left Upper Arm - Posterior; Right Upper Arm - Posterior; Left Forearm - Posterior (3); Right Forearm - Posterior; Left Lower Leg - Anterior; Right Lower Leg - Anterior (2); Left Breast; Right Breast (2); Scalp  Destruction of lesion - Head - Anterior (Face), Left Breast, Left Forearm - Posterior (3), Left Lower Leg - Anterior, Left Upper Arm - Posterior, Right Breast (2), Right Forearm - Posterior, Right Lower Leg - Anterior (2), Right Upper Arm - Posterior, Scalp Complexity: simple   Destruction method:  cryotherapy   Informed consent: discussed and consent obtained   Timeout:  patient name, date of birth, surgical site, and procedure verified Lesion destroyed using liquid nitrogen: Yes   Cryotherapy cycles:  1 Outcome: patient tolerated procedure well with no complications   Post-procedure details: wound care instructions given

## 2020-02-18 ENCOUNTER — Encounter: Payer: Self-pay | Admitting: Genetic Counselor

## 2020-02-22 ENCOUNTER — Other Ambulatory Visit: Payer: Self-pay | Admitting: Family Medicine

## 2020-02-22 DIAGNOSIS — Z1231 Encounter for screening mammogram for malignant neoplasm of breast: Secondary | ICD-10-CM

## 2020-02-23 ENCOUNTER — Other Ambulatory Visit: Payer: Self-pay

## 2020-02-23 ENCOUNTER — Ambulatory Visit
Admission: RE | Admit: 2020-02-23 | Discharge: 2020-02-23 | Disposition: A | Discharge status: Home / Self Care | Payer: Medicare Other | Source: Ambulatory Visit | Attending: Family Medicine | Admitting: Family Medicine

## 2020-02-23 DIAGNOSIS — Z1231 Encounter for screening mammogram for malignant neoplasm of breast: Secondary | ICD-10-CM

## 2020-03-04 ENCOUNTER — Ambulatory Visit (INDEPENDENT_AMBULATORY_CARE_PROVIDER_SITE_OTHER): Payer: Medicare Other | Admitting: Physician Assistant

## 2020-03-04 ENCOUNTER — Encounter: Payer: Self-pay | Admitting: Physician Assistant

## 2020-03-04 ENCOUNTER — Other Ambulatory Visit: Payer: Self-pay

## 2020-03-04 DIAGNOSIS — R21 Rash and other nonspecific skin eruption: Secondary | ICD-10-CM | POA: Diagnosis not present

## 2020-03-04 DIAGNOSIS — Z1283 Encounter for screening for malignant neoplasm of skin: Secondary | ICD-10-CM | POA: Diagnosis not present

## 2020-03-04 MED ORDER — MUPIROCIN 2 % EX OINT: 1.0000 "application " | TOPICAL_OINTMENT | Freq: Two times a day (BID) | CUTANEOUS | 1 refills | Status: AC

## 2020-03-04 NOTE — Progress Notes (Signed)
   Follow-Up Visit   Subjective  [name] [name] is a 64 y.o. female who presents for the following: Skin Problem (crustly and dry on the left knee, left eblow, and at the hairline) and Psoriasis.   The following portions of the chart were reviewed this encounter and updated as appropriate: Tobacco  Allergies  Meds  Problems  Med Hx  Surg Hx  Fam Hx      Objective  Well appearing patient in no apparent distress; mood and affect are within normal limits.  All skin waist up examined.  Objective  waist up: No atypical nevi No signs of non-mole skin cancer.   Objective  Left Temporal Scalp: Red macule with telangectasia and excoriations  Images     Assessment & Plan  Screening exam for skin cancer waist up  Skin examinations  Rash and other nonspecific skin eruption Left Temporal Scalp  Patient is having hip surgery in 4 days. Defer biopsy for potential skin cancer.  mupirocin ointment (BACTROBAN) 2 % - Left Temporal Scalp    I, Savon Cobbs, PA-C, have reviewed all documentation's for this visit.  The documentation on 03/04/20 for the exam, diagnosis, procedures and orders are all accurate and complete.

## 2020-03-29 ENCOUNTER — Other Ambulatory Visit: Payer: Self-pay

## 2020-03-29 ENCOUNTER — Encounter: Payer: Self-pay | Admitting: Physician Assistant

## 2020-03-29 ENCOUNTER — Ambulatory Visit (INDEPENDENT_AMBULATORY_CARE_PROVIDER_SITE_OTHER): Payer: BC Managed Care – PPO | Admitting: Physician Assistant

## 2020-03-29 DIAGNOSIS — L409 Psoriasis, unspecified: Secondary | ICD-10-CM

## 2020-03-29 DIAGNOSIS — L700 Acne vulgaris: Secondary | ICD-10-CM

## 2020-03-29 MED ORDER — TRETINOIN 0.1 % EX CREA: TOPICAL_CREAM | Freq: Every evening | CUTANEOUS | 2 refills | Status: AC

## 2020-03-29 MED ORDER — TRIAMCINOLONE ACETONIDE 0.1 % EX CREA: 1.0000 "application " | TOPICAL_CREAM | Freq: Two times a day (BID) | CUTANEOUS | 3 refills | Status: AC | PRN

## 2020-03-30 ENCOUNTER — Telehealth: Payer: Self-pay | Admitting: *Deleted

## 2020-03-30 NOTE — Telephone Encounter (Signed)
PA done via cover my meds. Waiting on approval.

## 2020-03-30 NOTE — Progress Notes (Signed)
   Follow-Up Visit   Subjective  [name] [name] is a 64 y.o. female who presents for the following: Follow-up (LEFT TEMPLE POSSIBLE BX PATIENT SAYS ITS BETTER).   The following portions of the chart were reviewed this encounter and updated as appropriate: Tobacco  Allergies  Meds  Problems  Med Hx  Surg Hx  Fam Hx      Objective  Well appearing patient in no apparent distress; mood and affect are within normal limits.  A focused examination was performed including waist up. Relevant physical exam findings are noted in the Assessment and Plan.  Objective  Head - Anterior (Face): Erythematous papules and pustules with comedones   Objective  Left Temple, Left Thigh - Posterior, Left Upper Arm - Posterior, Right Elbow - Posterior: Xerosis and plaques elbows   Assessment & Plan  Acne vulgaris Head - Anterior (Face)  Ordered Medications: tretinoin (RETIN-A) 0.1 % cream  Psoriasis (4) Right Elbow - Posterior; Left Upper Arm - Posterior; Left Thigh - Posterior; Left Temple  Ordered Medications: triamcinolone cream (KENALOG) 0.1 %    I, Brendia Dampier, PA-C, have reviewed all documentation's for this visit.  The documentation on 03/30/20 for the exam, diagnosis, procedures and orders are all accurate and complete.

## 2020-09-27 ENCOUNTER — Other Ambulatory Visit: Payer: Self-pay | Admitting: Family Medicine

## 2020-09-27 DIAGNOSIS — E2839 Other primary ovarian failure: Secondary | ICD-10-CM

## 2021-04-18 ENCOUNTER — Ambulatory Visit
Admission: RE | Admit: 2021-04-18 | Discharge: 2021-04-18 | Disposition: A | Discharge status: Home / Self Care | Payer: Medicare Other | Source: Ambulatory Visit | Attending: Family Medicine | Admitting: Family Medicine

## 2021-04-18 ENCOUNTER — Other Ambulatory Visit: Payer: Self-pay

## 2021-04-18 DIAGNOSIS — E2839 Other primary ovarian failure: Secondary | ICD-10-CM

## 2021-09-30 ENCOUNTER — Ambulatory Visit (HOSPITAL_COMMUNITY)
Admission: EM | Admit: 2021-09-30 | Discharge: 2021-09-30 | Disposition: A | Discharge status: Home / Self Care | Payer: Medicare Other | Attending: Emergency Medicine | Admitting: Emergency Medicine

## 2021-09-30 ENCOUNTER — Encounter (HOSPITAL_COMMUNITY): Payer: Self-pay

## 2021-09-30 DIAGNOSIS — S80811A Abrasion, right lower leg, initial encounter: Secondary | ICD-10-CM | POA: Diagnosis not present

## 2021-09-30 DIAGNOSIS — L089 Local infection of the skin and subcutaneous tissue, unspecified: Secondary | ICD-10-CM | POA: Diagnosis not present

## 2021-09-30 DIAGNOSIS — W5503XA Scratched by cat, initial encounter: Secondary | ICD-10-CM | POA: Diagnosis not present

## 2021-09-30 MED ORDER — LEVOFLOXACIN 750 MG PO TABS: 750.0000 mg | ORAL_TABLET | Freq: Every day | ORAL | 0 refills | Status: AC

## 2021-09-30 MED ORDER — LEVOFLOXACIN 750 MG PO TABS
750.0000 mg | ORAL_TABLET | Freq: Every day | ORAL | 0 refills | Status: DC
Start: 2021-09-30 — End: 2021-09-30

## 2021-09-30 NOTE — ED Provider Notes (Signed)
?[facility] ? ? ? ?CSN: [id number] ?Arrival date & time: 09/30/21  1612 ?  ? ?HISTORY  ? ?Chief Complaint  ?Patient presents with  ? Wound Check  ? ?HPI ?[name] [name] is a 66 y.o. female. Patient presents to urgent care today complaining of a wound on the right outer aspect of her lower leg that she incurred after her cat scratched her in that same location.  Patient states initially she cleaned it with some hydrogen peroxide and forgot all about it.  Patient states she noticed this morning that it was red and appeared to be draining clear fluid.  Patient states she does not recall when she last had a tetanus shot but further advises me that she does not want to have one today.  Patient's diastolic blood pressure is very elevated on arrival today.  Patient reports subjective allergies to sulfa and amoxicillin. ? ?The history is provided by the patient.  ?Past Medical History:  ?Diagnosis Date  ? ADHD (attention deficit hyperactivity disorder), inattentive type   ? Anemia   ? with associated shortness of breath  ? Generalized anxiety disorder   ? Hypercalcemia   ? Hyperparathyroidism ([place])   ? Lyme disease   ? Major depressive disorder   ? Osteopenia   ? Seasonal allergies   ? URI (upper respiratory infection)   ? current cough and head congestion- no fever  ? Vitamin D deficiency   ? ?Patient Active Problem List  ? Diagnosis Date Noted  ? Chronic right hip pain 04/23/2019  ? Generalized anxiety disorder   ? Major depressive disorder   ? ADHD (attention deficit hyperactivity disorder), inattentive type 2020  ? ?Past Surgical History:  ?Procedure Laterality Date  ? back cysts removed    ? multiple  removed x 4  ? BACK SURGERY  2009  ? Hemi laminectomy  ? DIAGNOSTIC LAPAROSCOPY    ? DILATION AND CURETTAGE OF UTERUS  2013  ? hysteroscopy resect polyp  ? PARATHYROIDECTOMY    ? thyroidectomy (partial)  ? WISDOM TOOTH EXTRACTION    ? ?OB History   ?No obstetric history on file. ?  ? ?Home Medications    ? ?Prior to Admission medications   ?Medication Sig Start Date End Date Taking? Authorizing Provider  ?amphetamine-dextroamphetamine (ADDERALL) 20 MG tablet Take 20 mg by mouth 3 (three) times daily as needed. Pt takes up to three times a day for ADD    [provider]  ?buPROPion (WELLBUTRIN SR) 150 MG 12 hr tablet Take 150 mg by mouth daily.     [provider]  ?Calcium Carbonate-Vitamin D (CALCIUM HIGH POTENCY/VITAMIN D) 600-200 MG-UNIT TABS Take by mouth daily.    [provider]  ?fexofenadine (ALLEGRA) 180 MG tablet Take 180 mg by mouth daily.    [provider]  ?levothyroxine (SYNTHROID, LEVOTHROID) 100 MCG tablet Take 100 mcg by mouth daily before breakfast.    [provider]  ?meloxicam (MOBIC) 15 MG tablet Take 1 tablet (15 mg total) by mouth daily. 05/04/19   Renita Papa, PA-C  ?Multiple Vitamin (MULTIVITAMIN WITH MINERALS) TABS Take 1 tablet by mouth daily.    [provider]  ?mupirocin ointment (BACTROBAN) 2 % Apply 1 application topically 2 (two) times daily. 03/04/20   Sheffield, Ronalee Red, PA-C  ?traZODone (DESYREL) 50 MG tablet 50 mg at bedtime. 04/29/16   [provider]  ?triamcinolone cream (KENALOG) 0.1 % Apply 1 application topically 2 (two) times daily as needed.  Not for face ,folds or groin 03/29/20   Warren Danes, PA-C  ?TRINTELLIX 5 MG TABS tablet Take 5 mg by mouth daily. 04/21/19   [provider]  ? ? ?Family History ?Family History  ?Problem Relation Age of Onset  ? Cancer Father   ? Cancer Sister   ? Cancer Brother   ? Cancer Paternal Uncle   ? ?Social History ?Social History  ? ?Tobacco Use  ? Smoking status: Former  ?  Packs/day: 0.35  ?  Years: 15.00  ?  Pack years: 5.25  ?  Types: Cigarettes  ? Smokeless tobacco: Never  ?Substance Use Topics  ? Alcohol use: Yes  ?  Alcohol/week: 1.0 standard drink  ?  Types: 1 Cans of beer per week  ?  Comment: Daily, alcohol content <1%  ? Drug use: No  ?  Comment: quit  2016  ? ?Allergies   ?Other, Sulfa antibiotics, and Augmentin [amoxicillin-pot clavulanate] ? ?Review of Systems ?Review of Systems ?Pertinent findings noted in history of present illness.  ? ?Physical Exam ?Triage Vital Signs ?ED Triage Vitals  ?Enc Vitals Group  ?   BP 04/28/21 0827 (!) 147/82  ?   Pulse Rate 04/28/21 0827 72  ?   Resp 04/28/21 0827 18  ?   Temp 04/28/21 0827 98.3 ?F (36.8 ?C)  ?   Temp Source 04/28/21 0827 Oral  ?   SpO2 04/28/21 0827 98 %  ?   Weight --   ?   Height --   ?   Head Circumference --   ?   Peak Flow --   ?   Pain Score 04/28/21 0826 5  ?   Pain Loc --   ?   Pain Edu? --   ?   Excl. in [place]? --   ?No data found. ? ?Updated Vital Signs ?BP (!) 135/109 (BP Location: Right Arm)   Pulse 90   Temp 98.4 ?F (36.9 ?C) (Oral)   Resp 18   SpO2 99%  ? ?Physical Exam ?Vitals and nursing note reviewed.  ?Constitutional:   ?   General: She is not in acute distress. ?   Appearance: Normal appearance. She is not ill-appearing.  ?HENT:  ?   Head: Normocephalic and atraumatic.  ?Eyes:  ?   General: Lids are normal.     ?   Right eye: No discharge.     ?   Left eye: No discharge.  ?   Extraocular Movements: Extraocular movements intact.  ?   Conjunctiva/sclera: Conjunctivae normal.  ?   Right eye: Right conjunctiva is not injected.  ?   Left eye: Left conjunctiva is not injected.  ?Neck:  ?   Trachea: Trachea and phonation normal.  ?Cardiovascular:  ?   Rate and Rhythm: Normal rate and regular rhythm.  ?   Pulses: Normal pulses.  ?   Heart sounds: Normal heart sounds. No murmur heard. ?  No friction rub. No gallop.  ?Pulmonary:  ?   Effort: Pulmonary effort is normal. No accessory muscle usage, prolonged expiration or respiratory distress.  ?   Breath sounds: Normal breath sounds. No stridor, decreased air movement or transmitted upper airway sounds. No decreased breath sounds, wheezing, rhonchi or rales.  ?Chest:  ?   Chest wall: No tenderness.  ?Musculoskeletal:     ?   General: Normal range of  motion.  ?   Cervical back: Normal range of motion and neck supple. Normal range of motion.  ?  Lymphadenopathy:  ?   Cervical: No cervical adenopathy.  ?Skin: ?   General: Skin is warm and dry.  ?   Findings: Erythema and lesion (Posterior lateral aspect of right lower extremity with erythema and clear fluid draining from puncture wound) present. No rash.  ?Neurological:  ?   General: No focal deficit present.  ?   Mental Status: She is alert and oriented to person, place, and time.  ?Psychiatric:     ?   Mood and Affect: Mood normal.     ?   Behavior: Behavior normal.  ? ? ?Visual Acuity ?Right Eye Distance:   ?Left Eye Distance:   ?Bilateral Distance:   ? ?Right Eye Near:   ?Left Eye Near:    ?Bilateral Near:    ? ?UC Couse / Diagnostics / Procedures:  ?  ?EKG ? ?Radiology ?No results found. ? ?Procedures ?Procedures (including critical care time) ? ?UC Diagnoses / Final Clinical Impressions(s)   ?I have reviewed the triage vital signs and the nursing notes. ? ?Pertinent labs & imaging results that were available during my care of the patient were reviewed by me and considered in my medical decision making (see chart for details).   ? ?Final diagnoses:  ?Wound infection  ?Cat scratch of right lower leg, initial encounter  ? ?Patient provided with a prescription for levofloxacin for 5 days for mild infection of wound on her right lower extremity.  I do not believe that tetanus vaccine is indicated due to lack of severity of the wound.  I believe the infection is superficial.  I would have preferred to prescribe Augmentin or Bactrim and contraindication for levofloxacin in patients over 65 but patient politely refused both of these antibiotics despite risks of levofloxacin.. ? ?ED Prescriptions   ? ? Medication Sig Dispense Auth. Provider  ? levofloxacin (LEVAQUIN) 750 MG tablet Take 1 tablet (750 mg total) by mouth daily for 5 days. 5 tablet Lynden Oxford Scales, PA-C  ? ?  ? ?PDMP not reviewed this  encounter. ? ?Pending results:  ?Labs Reviewed - No data to display ? ?Medications Ordered in UC: ?Medications - No data to display ? ?Disposition Upon Discharge:  ?Condition: stable for discharge home ?Home: take medications

## 2021-09-30 NOTE — Discharge Instructions (Addendum)
For soft tissue infection and for right lower leg, please begin levofloxacin once daily for the next 5 days.  Please follow-up with your primary care provider if you do not see significant improvement of the infection in your leg after completing antibiotics. ?

## 2021-09-30 NOTE — ED Triage Notes (Signed)
Pt presents with wound on right leg from her cat X 5 days ago. ?

## 2022-03-19 ENCOUNTER — Telehealth: Payer: Self-pay | Admitting: *Deleted

## 2022-03-19 NOTE — Patient Outreach (Signed)
  Care Coordination   03/19/2022 Name: [name] [name] MRN: [id number] DOB: 02-11-1956   Care Coordination Outreach Attempts:  An unsuccessful telephone outreach was attempted today to offer the patient information about available care coordination services as a benefit of their health plan.   Follow Up Plan:  Additional outreach attempts will be made to offer the patient care coordination information and services.   Encounter Outcome:  No Answer  Care Coordination Interventions Activated:  No   Care Coordination Interventions:  No, not indicated    Jacqlyn Larsen [facility], [place] RN Care Coordinator [phone]

## 2022-03-20 ENCOUNTER — Telehealth: Payer: Self-pay | Admitting: *Deleted

## 2022-03-20 ENCOUNTER — Encounter: Payer: Self-pay | Admitting: *Deleted

## 2022-03-20 NOTE — Patient Outreach (Signed)
  Care Coordination   Initial Visit Note   03/20/2022 Name: [name] [name] MRN: [id number] DOB: May 14, 1956  [name] [name] is a 66 y.o. year old female who sees Darron Doom, Maebelle Munroe, MD for primary care. I spoke with  [name] [name] by phone today.  What matters to the patients health and wellness today?  "To continue working towards being healthy"    Goals Addressed               This Visit's Progress     COMPLETED: "to continue working towards being healthy" (pt-stated)        Care Coordination Interventions: Patient interviewed about adult health maintenance status including  Annual Wellness Visit, pt states she completes AWV yearly  Provided education about importance of checking blood pressure at least a few times per week and recording, reviewed normal parameters for blood pressure, reviewed importance of taking medications as prescribed Evaluation of current treatment plan related to hypertension self management and patient's adherence to plan as established by provider Reviewed medications with patient and discussed importance of compliance Counseled on the importance of exercise goals with target of 150 minutes per week Advised patient, providing education and rationale, to monitor blood pressure daily and record, calling PCP for findings outside established parameters Provided education on prescribed diet low sodium  Discussed complications of poorly controlled blood pressure such as heart disease, stroke, circulatory complications, vision complications, kidney impairment, sexual dysfunction Assessed social determinant of health barriers RN care coordinator contact number provided to pt Patient appreciative and agreeable to today's telephone outreach but declines any further follow up, states she has no further needs          SDOH assessments and interventions completed:  Yes  SDOH Interventions Today    Flowsheet Row Most Recent Value  SDOH Interventions    Food Insecurity Interventions Intervention Not Indicated  Housing Interventions Intervention Not Indicated  Transportation Interventions Intervention Not Indicated        Care Coordination Interventions Activated:  Yes  Care Coordination Interventions:  Yes, provided   Follow up plan: No further intervention required.   Encounter Outcome:  Pt. Visit Completed   [name] [name] [facility], BSN [facility] RN Care Coordinator [phone]

## 2023-07-21 ENCOUNTER — Emergency Department (HOSPITAL_COMMUNITY): Payer: Medicare Other

## 2023-07-21 ENCOUNTER — Encounter (HOSPITAL_COMMUNITY): Payer: Self-pay

## 2023-07-21 ENCOUNTER — Emergency Department (HOSPITAL_COMMUNITY)
Admission: EM | Admit: 2023-07-21 | Discharge: 2023-07-22 | Disposition: A | Discharge status: Home / Self Care | Payer: Medicare Other | Attending: Emergency Medicine | Admitting: Emergency Medicine

## 2023-07-21 DIAGNOSIS — R55 Syncope and collapse: Secondary | ICD-10-CM | POA: Insufficient documentation

## 2023-07-21 HISTORY — DX: Unspecified osteoarthritis, unspecified site: M19.90

## 2023-07-21 HISTORY — DX: Essential (primary) hypertension: I10

## 2023-07-21 HISTORY — DX: Atherosclerotic heart disease of native coronary artery without angina pectoris: I25.10

## 2023-07-21 HISTORY — DX: Unspecified asthma, uncomplicated: J45.909

## 2023-07-21 LAB — BASIC METABOLIC PANEL
Anion gap: 12 (ref 5–15)
BUN: 20 mg/dL (ref 8–23)
CO2: 23 mmol/L (ref 22–32)
Calcium: 9.4 mg/dL (ref 8.9–10.3)
Chloride: 104 mmol/L (ref 98–111)
Creatinine, Ser: 0.82 mg/dL (ref 0.44–1.00)
GFR, Estimated: >60 mL/min (ref >60)
Glucose, Bld: 111 mg/dL — ABNORMAL HIGH (ref 70–99)
Potassium: 3.8 mmol/L (ref 3.5–5.1)
Sodium: 139 mmol/L (ref 135–145)

## 2023-07-21 LAB — CBC
HCT: 42.8 % (ref 36.0–46.0)
Hemoglobin: 13.9 g/dL (ref 12.0–15.0)
MCH: 29 pg (ref 26.0–34.0)
MCHC: 32.5 g/dL (ref 30.0–36.0)
MCV: 89.4 fL (ref 80.0–100.0)
Platelets: 409 10*3/uL — ABNORMAL HIGH (ref 150–400)
RBC: 4.79 MIL/uL (ref 3.87–5.11)
RDW: 12.9 % (ref 11.5–15.5)
WBC: 10.1 10*3/uL (ref 4.0–10.5)
nRBC: 0 % (ref 0.0–0.2)

## 2023-07-21 LAB — TROPONIN I (HIGH SENSITIVITY): Troponin I (High Sensitivity): 3 ng/L (ref <18)

## 2023-07-21 NOTE — ED Provider Notes (Signed)
[place] EMERGENCY DEPARTMENT AT [facility] Provider Note   CSN: [id number] Arrival date & time: 07/21/23  2132     History {Add pertinent medical, surgical, social history, OB history to HPI:1} Chief Complaint  Patient presents with   Near Syncope    [name] [name] is a 68 y.o. female.  The history is provided by the patient and medical records.  Near Syncope   68 y.o. F with hx of ADHD, anxiety, depression, vit D deficiency, hyperparathyroidism, presenting to the ED after near syncopal event.  Patient reports she had been up and moving around today but feeling fine overall.  This evening she walked downstairs to put some laundry in the washer and then walked back up and when she reached top of stairs she felt lightheaded, nauseated, and like she may pass out.  She sat down on the sofa and symptoms improved after about 15 minutes.  She never lost consciousness.  States she has had some occasional lightheadedness with standing recently but never to this degree.  States she has not really changed any of her medications, supplements or anything about her routine.  She does report she has gained a bit of weight and is a little bit deconditioned from not exercising recently so thinks that may be contributing.  She has not had any chest pain.  No shortness of breath.  Home Medications Prior to Admission medications   Medication Sig Start Date End Date Taking? Authorizing Provider  amphetamine-dextroamphetamine (ADDERALL) 20 MG tablet Take 20 mg by mouth 3 (three) times daily as needed. Pt takes up to three times a day for ADD    [provider]  buPROPion (WELLBUTRIN SR) 150 MG 12 hr tablet Take 150 mg by mouth daily.     [provider]  Calcium Carbonate-Vitamin D (CALCIUM HIGH POTENCY/VITAMIN D) 600-200 MG-UNIT TABS Take by mouth daily.    [provider]  fexofenadine (ALLEGRA) 180 MG tablet Take 180 mg by mouth daily.    [provider]  levothyroxine (SYNTHROID, LEVOTHROID) 100 MCG tablet Take 100 mcg by mouth daily before breakfast.    [provider]  meloxicam (MOBIC) 15 MG tablet Take 1 tablet (15 mg total) by mouth daily. 05/04/19   Fawze, Mina A, PA-C  Multiple Vitamin (MULTIVITAMIN WITH MINERALS) TABS Take 1 tablet by mouth daily.    [provider]  mupirocin ointment (BACTROBAN) 2 % Apply 1 application topically 2 (two) times daily. 03/04/20   Sheffield, Judye Bos, PA-C  traZODone (DESYREL) 50 MG tablet 50 mg at bedtime. 04/29/16   [provider]  triamcinolone cream (KENALOG) 0.1 % Apply 1 application topically 2 (two) times daily as needed. Not for face ,folds or groin 03/29/20   Sheffield, Kelli R, PA-C  TRINTELLIX 5 MG TABS tablet Take 5 mg by mouth daily. 04/21/19   [provider]      Allergies    Other, Sulfa antibiotics, and Augmentin [amoxicillin-pot clavulanate]    Review of Systems   Review of Systems  Cardiovascular:  Positive for near-syncope.  All other systems reviewed and are negative.   Physical Exam Updated Vital Signs BP (!) 158/91   Pulse 99   Temp 98.5 F (36.9 C) (Oral)   Resp 17   SpO2 99%   Physical Exam Vitals and nursing note reviewed.  Constitutional:      Appearance: She is well-developed.  HENT:     Head: Normocephalic and atraumatic.  Eyes:  Conjunctiva/sclera: Conjunctivae normal.     Pupils: Pupils are equal, round, and reactive to light.  Cardiovascular:     Rate and Rhythm: Normal rate and regular rhythm.     Heart sounds: Normal heart sounds.  Pulmonary:     Effort: Pulmonary effort is normal.     Breath sounds: Normal breath sounds.  Abdominal:     General: Bowel sounds are normal.     Palpations: Abdomen is soft.  Musculoskeletal:        General: Normal range of motion.     Cervical back: Normal range of motion.  Skin:    General: Skin is warm and dry.  Neurological:     Mental Status: She is alert and oriented to  person, place, and time.     Comments: AAOx3, moving extremities well without difficulty, speech clear and goal oriented, no focal deficits     ED Results / Procedures / Treatments   Labs (all labs ordered are listed, but only abnormal results are displayed) Labs Reviewed  CBC - Abnormal; Notable for the following components:      Result Value   Platelets 409 (*)    All other components within normal limits  BASIC METABOLIC PANEL  URINALYSIS, ROUTINE W REFLEX MICROSCOPIC  TROPONIN I (HIGH SENSITIVITY)    EKG None  Radiology No results found.  Procedures Procedures  {Document cardiac monitor, telemetry assessment procedure when appropriate:1}  Medications Ordered in ED Medications - No data to display  ED Course/ Medical Decision Making/ A&P   {   Click here for ABCD2, HEART and other calculatorsREFRESH Note before signing :1}                              Medical Decision Making Amount and/or Complexity of Data Reviewed Labs: ordered. Radiology: ordered.   ***  {Document critical care time when appropriate:1} {Document review of labs and clinical decision tools ie heart score, Chads2Vasc2 etc:1}  {Document your independent review of radiology images, and any outside records:1} {Document your discussion with family members, caretakers, and with consultants:1} {Document social determinants of health affecting pt's care:1} {Document your decision making why or why not admission, treatments were needed:1} Final Clinical Impression(s) / ED Diagnoses Final diagnoses:  None    Rx / DC Orders ED Discharge Orders     None

## 2023-07-21 NOTE — ED Triage Notes (Signed)
Patient arrived with complaints of near syncope. States she was dizzy, nauseated, and lightheaded.

## 2023-07-22 DIAGNOSIS — R55 Syncope and collapse: Secondary | ICD-10-CM | POA: Diagnosis not present

## 2023-07-22 LAB — URINALYSIS, ROUTINE W REFLEX MICROSCOPIC
Bacteria, UA: NONE SEEN
Bilirubin Urine: NEGATIVE
Glucose, UA: NEGATIVE mg/dL
Hgb urine dipstick: NEGATIVE
Ketones, ur: NEGATIVE mg/dL
Nitrite: NEGATIVE
Protein, ur: NEGATIVE mg/dL
Specific Gravity, Urine: 1.006 (ref 1.005–1.030)
pH: 5 (ref 5.0–8.0)

## 2023-07-22 LAB — TROPONIN I (HIGH SENSITIVITY): Troponin I (High Sensitivity): 3 ng/L (ref <18)

## 2023-07-22 NOTE — Discharge Instructions (Signed)
Workup today is reassuring.  Make sure to eat regularly and stay hydrated. Follow-up with your primary care doctor. Return to the ED for new or worsening symptoms.

## 2024-01-16 ENCOUNTER — Other Ambulatory Visit (HOSPITAL_BASED_OUTPATIENT_CLINIC_OR_DEPARTMENT_OTHER): Payer: Self-pay | Admitting: Family Medicine

## 2024-01-16 DIAGNOSIS — E2839 Other primary ovarian failure: Secondary | ICD-10-CM

## 2024-09-15 ENCOUNTER — Other Ambulatory Visit (HOSPITAL_BASED_OUTPATIENT_CLINIC_OR_DEPARTMENT_OTHER)
